# Patient Record
Sex: Female | Born: 1975 | Race: White | Hispanic: No | Marital: Single | Smoking: Current every day smoker
Health system: Southern US, Community
[De-identification: ages and names within clinical notes are randomized; demographics above are authoritative.]

---

## 1998-05-18 ENCOUNTER — Ambulatory Visit (HOSPITAL_COMMUNITY): Admission: RE | Admit: 1998-05-18 | Discharge: 1998-05-18 | Payer: Self-pay | Admitting: Obstetrics

## 1998-05-28 ENCOUNTER — Inpatient Hospital Stay (HOSPITAL_COMMUNITY): Admission: AD | Admit: 1998-05-28 | Discharge: 1998-05-28 | Payer: Self-pay | Admitting: *Deleted

## 1998-06-01 ENCOUNTER — Inpatient Hospital Stay (HOSPITAL_COMMUNITY): Admission: AD | Admit: 1998-06-01 | Discharge: 1998-06-01 | Payer: Self-pay | Admitting: Obstetrics

## 1998-06-04 ENCOUNTER — Inpatient Hospital Stay (HOSPITAL_COMMUNITY): Admission: AD | Admit: 1998-06-04 | Discharge: 1998-06-04 | Payer: Self-pay | Admitting: Obstetrics

## 1998-06-14 ENCOUNTER — Inpatient Hospital Stay: Admission: AD | Admit: 1998-06-14 | Discharge: 1998-06-14 | Payer: Self-pay | Admitting: Obstetrics

## 1998-06-22 ENCOUNTER — Inpatient Hospital Stay (HOSPITAL_COMMUNITY): Admission: AD | Admit: 1998-06-22 | Discharge: 1998-06-22 | Payer: Self-pay | Admitting: Obstetrics

## 1998-06-27 ENCOUNTER — Inpatient Hospital Stay (HOSPITAL_COMMUNITY): Admission: AD | Admit: 1998-06-27 | Discharge: 1998-06-27 | Payer: Self-pay | Admitting: *Deleted

## 1998-06-29 ENCOUNTER — Observation Stay (HOSPITAL_COMMUNITY): Admission: AD | Admit: 1998-06-29 | Discharge: 1998-06-30 | Payer: Self-pay | Admitting: Obstetrics & Gynecology

## 1998-07-03 ENCOUNTER — Encounter (HOSPITAL_COMMUNITY): Admission: RE | Admit: 1998-07-03 | Discharge: 1998-07-06 | Payer: Self-pay | Admitting: Obstetrics & Gynecology

## 1998-07-04 ENCOUNTER — Inpatient Hospital Stay (HOSPITAL_COMMUNITY): Admission: AD | Admit: 1998-07-04 | Discharge: 1998-07-06 | Payer: Self-pay | Admitting: *Deleted

## 1998-07-21 ENCOUNTER — Inpatient Hospital Stay (HOSPITAL_COMMUNITY): Admission: AD | Admit: 1998-07-21 | Discharge: 1998-07-21 | Payer: Self-pay | Admitting: *Deleted

## 1998-09-12 ENCOUNTER — Emergency Department (HOSPITAL_COMMUNITY): Admission: EM | Admit: 1998-09-12 | Discharge: 1998-09-12 | Payer: Self-pay | Admitting: Emergency Medicine

## 1998-09-12 ENCOUNTER — Encounter: Payer: Self-pay | Admitting: Emergency Medicine

## 2000-01-22 ENCOUNTER — Emergency Department (HOSPITAL_COMMUNITY): Admission: EM | Admit: 2000-01-22 | Discharge: 2000-01-22 | Payer: Self-pay | Admitting: Emergency Medicine

## 2006-11-26 ENCOUNTER — Inpatient Hospital Stay (HOSPITAL_COMMUNITY): Admission: AD | Admit: 2006-11-26 | Discharge: 2006-11-27 | Payer: Self-pay | Admitting: Obstetrics & Gynecology

## 2007-04-27 ENCOUNTER — Emergency Department (HOSPITAL_COMMUNITY): Admission: EM | Admit: 2007-04-27 | Discharge: 2007-04-27 | Payer: Self-pay | Admitting: Emergency Medicine

## 2009-07-31 ENCOUNTER — Inpatient Hospital Stay (HOSPITAL_COMMUNITY): Admission: AD | Admit: 2009-07-31 | Discharge: 2009-07-31 | Payer: Self-pay | Admitting: Obstetrics & Gynecology

## 2009-10-06 ENCOUNTER — Ambulatory Visit (HOSPITAL_COMMUNITY): Admission: RE | Admit: 2009-10-06 | Discharge: 2009-10-06 | Payer: Self-pay | Admitting: Obstetrics & Gynecology

## 2010-01-30 ENCOUNTER — Encounter: Payer: Self-pay | Admitting: Obstetrics & Gynecology

## 2010-01-30 ENCOUNTER — Ambulatory Visit: Payer: Self-pay | Admitting: Physician Assistant

## 2010-01-30 ENCOUNTER — Inpatient Hospital Stay (HOSPITAL_COMMUNITY): Admission: AD | Admit: 2010-01-30 | Discharge: 2010-02-01 | Payer: Self-pay | Admitting: Obstetrics & Gynecology

## 2010-03-07 ENCOUNTER — Inpatient Hospital Stay (HOSPITAL_COMMUNITY): Admission: AD | Admit: 2010-03-07 | Discharge: 2010-03-07 | Payer: Self-pay | Admitting: Family Medicine

## 2010-03-07 ENCOUNTER — Ambulatory Visit: Payer: Self-pay | Admitting: Obstetrics and Gynecology

## 2010-11-21 LAB — CBC
HCT: 33.5 % — ABNORMAL LOW (ref 36.0–46.0)
Hemoglobin: 11.5 g/dL — ABNORMAL LOW (ref 12.0–15.0)
MCH: 32.8 pg (ref 26.0–34.0)
MCHC: 34.3 g/dL (ref 30.0–36.0)
MCV: 95.6 fL (ref 78.0–100.0)
Platelets: 284 10*3/uL (ref 150–400)
RBC: 3.5 MIL/uL — ABNORMAL LOW (ref 3.87–5.11)
RDW: 11.8 % (ref 11.5–15.5)
WBC: 8.6 10*3/uL (ref 4.0–10.5)

## 2010-11-21 LAB — WET PREP, GENITAL: Trich, Wet Prep: NONE SEEN

## 2010-11-22 LAB — CBC
MCV: 97.6 fL (ref 78.0–100.0)
RBC: 4.19 MIL/uL (ref 3.87–5.11)
WBC: 16.1 10*3/uL — ABNORMAL HIGH (ref 4.0–10.5)

## 2010-11-22 LAB — RPR: RPR Ser Ql: NONREACTIVE

## 2010-12-08 LAB — URINALYSIS, ROUTINE W REFLEX MICROSCOPIC
Bilirubin Urine: NEGATIVE
Nitrite: NEGATIVE
Specific Gravity, Urine: 1.015 (ref 1.005–1.030)
pH: 7.5 (ref 5.0–8.0)

## 2010-12-08 LAB — RAPID URINE DRUG SCREEN, HOSP PERFORMED
Cocaine: NOT DETECTED
Opiates: NOT DETECTED
Tetrahydrocannabinol: NOT DETECTED

## 2010-12-08 LAB — ABO/RH: ABO/RH(D): A POS

## 2010-12-08 LAB — WET PREP, GENITAL: Trich, Wet Prep: NONE SEEN

## 2013-04-14 ENCOUNTER — Emergency Department (HOSPITAL_COMMUNITY)
Admission: EM | Admit: 2013-04-14 | Discharge: 2013-04-15 | Disposition: A | Discharge status: Home / Self Care | Payer: No Typology Code available for payment source | Attending: Emergency Medicine | Admitting: Emergency Medicine

## 2013-04-14 ENCOUNTER — Encounter (HOSPITAL_COMMUNITY): Payer: Self-pay

## 2013-04-14 DIAGNOSIS — Y9389 Activity, other specified: Secondary | ICD-10-CM | POA: Insufficient documentation

## 2013-04-14 DIAGNOSIS — Y92410 Unspecified street and highway as the place of occurrence of the external cause: Secondary | ICD-10-CM | POA: Insufficient documentation

## 2013-04-14 DIAGNOSIS — Z880 Allergy status to penicillin: Secondary | ICD-10-CM | POA: Insufficient documentation

## 2013-04-14 DIAGNOSIS — IMO0002 Reserved for concepts with insufficient information to code with codable children: Secondary | ICD-10-CM | POA: Insufficient documentation

## 2013-04-14 DIAGNOSIS — T148XXA Other injury of unspecified body region, initial encounter: Secondary | ICD-10-CM

## 2013-04-14 MED ORDER — HYDROCODONE-ACETAMINOPHEN 5-325 MG PO TABS
1.0000 | ORAL_TABLET | Freq: Once | ORAL | Status: AC
  Administered 2013-04-14: 1 via ORAL
  Filled 2013-04-14: qty 1

## 2013-04-14 MED ORDER — BACITRACIN-NEOMYCIN-POLYMYXIN OINTMENT TUBE
TOPICAL_OINTMENT | Freq: Once | CUTANEOUS | Status: AC
  Administered 2013-04-15: via TOPICAL
  Filled 2013-04-14: qty 15

## 2013-04-14 MED ORDER — HYDROCODONE-ACETAMINOPHEN 5-325 MG PO TABS: 1.0000 | ORAL_TABLET | Freq: Four times a day (QID) | ORAL | Status: DC | PRN
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
## 2013-04-15 NOTE — ED Provider Notes (Signed)
Medical screening examination/treatment/procedure(s) were performed by non-physician practitioner and as supervising physician I was immediately available for consultation/collaboration.   Hanley Seamen, MD 04/15/13 [phone]

## 2013-04-26 ENCOUNTER — Emergency Department (HOSPITAL_COMMUNITY): Payer: No Typology Code available for payment source

## 2013-04-26 ENCOUNTER — Emergency Department (HOSPITAL_COMMUNITY)
Admission: EM | Admit: 2013-04-26 | Discharge: 2013-04-26 | Disposition: A | Discharge status: Home / Self Care | Payer: No Typology Code available for payment source | Attending: Emergency Medicine | Admitting: Emergency Medicine

## 2013-04-26 ENCOUNTER — Encounter (HOSPITAL_COMMUNITY): Payer: Self-pay | Admitting: *Deleted

## 2013-04-26 DIAGNOSIS — Z5189 Encounter for other specified aftercare: Secondary | ICD-10-CM | POA: Insufficient documentation

## 2013-04-26 DIAGNOSIS — S4991XD Unspecified injury of right shoulder and upper arm, subsequent encounter: Secondary | ICD-10-CM

## 2013-04-26 DIAGNOSIS — Z880 Allergy status to penicillin: Secondary | ICD-10-CM | POA: Insufficient documentation

## 2013-04-26 DIAGNOSIS — Z87828 Personal history of other (healed) physical injury and trauma: Secondary | ICD-10-CM | POA: Insufficient documentation

## 2013-04-26 DIAGNOSIS — M549 Dorsalgia, unspecified: Secondary | ICD-10-CM | POA: Insufficient documentation

## 2013-04-26 DIAGNOSIS — Z79899 Other long term (current) drug therapy: Secondary | ICD-10-CM | POA: Insufficient documentation

## 2013-04-26 DIAGNOSIS — M25529 Pain in unspecified elbow: Secondary | ICD-10-CM | POA: Insufficient documentation

## 2013-04-26 MED ORDER — KETOROLAC TROMETHAMINE 60 MG/2ML IM SOLN
60.0000 mg | Freq: Once | INTRAMUSCULAR | Status: AC
  Administered 2013-04-26: 60 mg via INTRAMUSCULAR
  Filled 2013-04-26: qty 2

## 2013-04-26 MED ORDER — IBUPROFEN 800 MG PO TABS: 800.0000 mg | ORAL_TABLET | Freq: Three times a day (TID) | ORAL | Status: DC | PRN

## 2013-04-26 MED ORDER — HYDROCODONE-ACETAMINOPHEN 5-325 MG PO TABS: 1.0000 | ORAL_TABLET | Freq: Four times a day (QID) | ORAL | Status: DC | PRN
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
## 2013-04-26 NOTE — ED Provider Notes (Signed)
Medical screening examination/treatment/procedure(s) were performed by non-physician practitioner and as supervising physician I was immediately available for consultation/collaboration.    Rubert Frediani R Argenis Kumari, MD 04/26/13 1405 

## 2013-07-11 ENCOUNTER — Encounter (HOSPITAL_COMMUNITY): Payer: Self-pay | Admitting: Emergency Medicine

## 2013-07-11 ENCOUNTER — Emergency Department (HOSPITAL_COMMUNITY)
Admission: EM | Admit: 2013-07-11 | Discharge: 2013-07-11 | Disposition: A | Discharge status: Home / Self Care | Payer: Medicaid Other | Attending: Emergency Medicine | Admitting: Emergency Medicine

## 2013-07-11 DIAGNOSIS — R590 Localized enlarged lymph nodes: Secondary | ICD-10-CM

## 2013-07-11 DIAGNOSIS — Z3202 Encounter for pregnancy test, result negative: Secondary | ICD-10-CM | POA: Insufficient documentation

## 2013-07-11 DIAGNOSIS — Z880 Allergy status to penicillin: Secondary | ICD-10-CM | POA: Insufficient documentation

## 2013-07-11 DIAGNOSIS — N898 Other specified noninflammatory disorders of vagina: Secondary | ICD-10-CM | POA: Insufficient documentation

## 2013-07-11 DIAGNOSIS — Z79899 Other long term (current) drug therapy: Secondary | ICD-10-CM | POA: Insufficient documentation

## 2013-07-11 DIAGNOSIS — R599 Enlarged lymph nodes, unspecified: Secondary | ICD-10-CM | POA: Insufficient documentation

## 2013-07-11 DIAGNOSIS — B9689 Other specified bacterial agents as the cause of diseases classified elsewhere: Secondary | ICD-10-CM

## 2013-07-11 DIAGNOSIS — N760 Acute vaginitis: Secondary | ICD-10-CM | POA: Insufficient documentation

## 2013-07-11 LAB — URINALYSIS, ROUTINE W REFLEX MICROSCOPIC
Ketones, ur: NEGATIVE mg/dL
Leukocytes, UA: NEGATIVE
Nitrite: NEGATIVE
Protein, ur: NEGATIVE mg/dL
Urobilinogen, UA: 0.2 mg/dL (ref 0.0–1.0)

## 2013-07-11 LAB — PREGNANCY, URINE: Preg Test, Ur: NEGATIVE

## 2013-07-11 MED ORDER — CEFTRIAXONE SODIUM 250 MG IJ SOLR
250.0000 mg | Freq: Once | INTRAMUSCULAR | Status: AC
  Administered 2013-07-11: 250 mg via INTRAMUSCULAR
  Filled 2013-07-11: qty 250

## 2013-07-11 MED ORDER — AZITHROMYCIN 250 MG PO TABS
1000.0000 mg | ORAL_TABLET | Freq: Once | ORAL | Status: AC
  Administered 2013-07-11: 1000 mg via ORAL
  Filled 2013-07-11: qty 4

## 2013-07-11 NOTE — ED Provider Notes (Signed)
CSN: [id number]     Arrival date & time 07/11/13  1131 History  This chart was scribed for non-physician practitioner Fayrene Helper, PA-C working with Dagmar Hait, MD by Leone Payor, ED Scribe. This patient was seen in room TR11C/TR11C and the patient's care was started at 1131.     Chief Complaint  Patient presents with  . Abscess    The history is provided by the patient. No language interpreter was used.    HPI Comments: Katie Mcdonald is a 37 y.o. female who presents to the Emergency Department complaining of 4 days of gradual onset, gradually worsening, constant pain and swelling to the right groin area. Pt states she has applied warm compresses without relief. She denies similar symptoms in the past. She reports last sexual encounter was 1.5-2 months ago. She denies vaginal bleeding, vaginal discharge, dysuria, fever.   History reviewed. No pertinent past medical history. History reviewed. No pertinent past surgical history. History reviewed. No pertinent family history. History  Substance Use Topics  . Smoking status: Not on file  . Smokeless tobacco: Not on file  . Alcohol Use: Not on file   OB History   Grav Para Term Preterm Abortions TAB SAB Ect Mult Living                 Review of Systems  Constitutional: Negative for fever.  Genitourinary: Negative for dysuria, vaginal bleeding and vaginal discharge.       Right groin pain and swelling    Allergies  Penicillins and Onion  Home Medications   Current Outpatient Rx  Name  Route  Sig  Dispense  Refill  . CALCIUM PO   Oral   Take 1 tablet by mouth 2 (two) times daily.         . Multiple Vitamin (MULTIVITAMIN WITH MINERALS) TABS tablet   Oral   Take 1 tablet by mouth daily.          BP 127/74  Pulse 71  Temp(Src) 98.2 F (36.8 C) (Oral)  Resp 22  Ht 5\' 3"  (1.6 m)  Wt 200 lb 4.8 oz (90.855 kg)  BMI 35.49 kg/m2  SpO2 99% Physical Exam  Nursing note and vitals reviewed. Constitutional: She is  oriented to person, place, and time. She appears well-developed and well-nourished. No distress.  HENT:  Head: Normocephalic and atraumatic.  Eyes: Conjunctivae are normal.  Neck: Normal range of motion. Neck supple.  Cardiovascular: Normal rate and regular rhythm.   Pulmonary/Chest: Effort normal and breath sounds normal. She exhibits no tenderness.  Abdominal: Soft. She exhibits no distension. There is no tenderness. Hernia confirmed negative in the right inguinal area.  Genitourinary: Uterus normal. There is no rash or lesion on the right labia. There is no rash or lesion on the left labia. Cervix exhibits no motion tenderness and no discharge. Right adnexum displays no mass and no tenderness. Left adnexum displays no mass and no tenderness. No erythema, tenderness or bleeding around the vagina. Vaginal discharge (mild thin white discharge) found.  Chaperone present:  Lymphadenopathy:       Right: Inguinal adenopathy present.       Left: No inguinal adenopathy present.  Neurological: She is alert and oriented to person, place, and time.  Skin: Skin is warm and dry. No rash noted.  Psychiatric: She has a normal mood and affect.    ED Course  Procedures (including critical care time)  DIAGNOSTIC STUDIES: Oxygen Saturation is 99% on RA, normal by  my interpretation.    COORDINATION OF CARE: 12:13 PM Discussed with pt that it is more likely the area is an enlarged lymph node rather than an abscess. Will perform a pelvic exam. Discussed treatment plan with pt at bedside and pt agreed to plan.   1:48 PM No discomfort or evidence of PID on pelvic exam.  Wet prep with moderate clue cell.  Will treat for BV with flagyl.  WBC TNTC, therefore will give rocephin/zithromax as prophylactic treatment for possible STI.  Cultures sent.  Return precaution discussed.  Low suspicion for cancer or cutaneous abscess.    Labs Review Labs Reviewed - No data to display Imaging Review No results  found.  EKG Interpretation   None       MDM   1. Inguinal lymphadenopathy   2. BV (bacterial vaginosis)    BP 127/74  Pulse 71  Temp(Src) 98.2 F (36.8 C) (Oral)  Resp 22  Ht 5\' 3"  (1.6 m)  Wt 200 lb 4.8 oz (90.855 kg)  BMI 35.49 kg/m2  SpO2 99%   I personally performed the services described in this documentation, which was scribed in my presence. The recorded information has been reviewed and is accurate.    Fayrene Helper, PA-C 07/11/13 1351

## 2013-07-11 NOTE — ED Provider Notes (Signed)
Medical screening examination/treatment/procedure(s) were performed by non-physician practitioner and as supervising physician I was immediately available for consultation/collaboration.  EKG Interpretation   None         Dagmar Hait, MD 07/11/13 1538

## 2013-07-11 NOTE — ED Notes (Signed)
Pt in c/o abscess to groin area since Sunday, denies drainage from area, denies history of same

## 2015-12-28 ENCOUNTER — Encounter (HOSPITAL_COMMUNITY): Payer: Self-pay | Admitting: Emergency Medicine

## 2015-12-28 ENCOUNTER — Emergency Department (HOSPITAL_COMMUNITY): Payer: Self-pay

## 2015-12-28 ENCOUNTER — Emergency Department (HOSPITAL_COMMUNITY)
Admission: EM | Admit: 2015-12-28 | Discharge: 2015-12-28 | Disposition: A | Discharge status: Home / Self Care | Payer: Self-pay | Attending: Emergency Medicine | Admitting: Emergency Medicine

## 2015-12-28 DIAGNOSIS — Z79899 Other long term (current) drug therapy: Secondary | ICD-10-CM | POA: Insufficient documentation

## 2015-12-28 DIAGNOSIS — F17200 Nicotine dependence, unspecified, uncomplicated: Secondary | ICD-10-CM | POA: Insufficient documentation

## 2015-12-28 DIAGNOSIS — J189 Pneumonia, unspecified organism: Secondary | ICD-10-CM

## 2015-12-28 DIAGNOSIS — J159 Unspecified bacterial pneumonia: Secondary | ICD-10-CM | POA: Insufficient documentation

## 2015-12-28 MED ORDER — LEVOFLOXACIN 750 MG PO TABS
750.0000 mg | ORAL_TABLET | Freq: Once | ORAL | Status: AC
  Administered 2015-12-28: 750 mg via ORAL
  Filled 2015-12-28: qty 1

## 2015-12-28 MED ORDER — GUAIFENESIN-CODEINE 100-10 MG/5ML PO SOLN
5.0000 mL | Freq: Once | ORAL | Status: AC
  Administered 2015-12-28: 5 mL via ORAL
  Filled 2015-12-28: qty 5

## 2015-12-28 MED ORDER — LEVOFLOXACIN 750 MG PO TABS: 750.0000 mg | ORAL_TABLET | Freq: Every day | ORAL | Status: AC

## 2015-12-28 MED ORDER — GUAIFENESIN-CODEINE 100-10 MG/5ML PO SOLN: 5.0000 mL | Freq: Three times a day (TID) | ORAL | Status: AC | PRN

## 2015-12-28 NOTE — Discharge Instructions (Signed)
Take medications as prescribed. Follow up with your doctor in regards to your hospital visit. If you do not have a primary physician, call the phone number listed on discharge instructions for help finding one. You may return to the emergency department if symptoms worsen, become progressive, or become more concerning.   Pneumonia, Adult Pneumonia is an infection of the lungs.   CAUSES Pneumonia may be caused by bacteria or a virus. Usually, these infections are caused by breathing infectious particles into the lungs (respiratory tract).  SYMPTOMS   Cough.   Fever.   Chest pain.   Increased rate of breathing.   Wheezing.   Mucus production.   DIAGNOSIS  If you have the common symptoms of pneumonia, your caregiver will typically confirm the diagnosis with a chest X-ray. The X-ray will show an abnormality in the lung (pulmonary infiltrate) if you have pneumonia. Other tests of your blood, urine, or sputum may be done to find the specific cause of your pneumonia. Your caregiver may also do tests (blood gases or pulse oximetry) to see how well your lungs are working.  TREATMENT  Some forms of pneumonia may be spread to other people when you cough or sneeze. You may be asked to wear a mask before and during your exam. Pneumonia that is caused by bacteria is treated with antibiotic medicine. Pneumonia that is caused by the influenza virus may be treated with an antiviral medicine. Most other viral infections must run their course. These infections will not respond to antibiotics.   PREVENTION A pneumococcal shot (vaccine) is available to prevent a common bacterial cause of pneumonia. This is usually suggested for:  People over 38 years old.   Patients on chemotherapy.   People with chronic lung problems, such as bronchitis or emphysema.   People with immune system problems.  If you are over 65 or have a high risk condition, you may receive the pneumococcal vaccine if you have not  received it before. In some countries, a routine influenza vaccine is also recommended. This vaccine can help prevent some cases of pneumonia.You may be offered the influenza vaccine as part of your care. If you smoke, it is time to quit. You may receive instructions on how to stop smoking. Your caregiver can provide medicines and counseling to help you quit.  HOME CARE INSTRUCTIONS   Cough suppressants may be used if you are losing too much rest. However, coughing protects you by clearing your lungs. You should avoid using cough suppressants if you can.   Your caregiver may have prescribed medicine if he or she thinks your pneumonia is caused by a bacteria or influenza. Finish your medicine even if you start to feel better.   Your caregiver may also prescribe an expectorant. This loosens the mucus to be coughed up.   Only take over-the-counter or prescription medicines for pain, discomfort, or fever as directed by your caregiver.   Do not smoke. Smoking is a common cause of bronchitis and can contribute to pneumonia. If you are a smoker and continue to smoke, your cough may last several weeks after your pneumonia has cleared.   A cold steam vaporizer or humidifier in your room or home may help loosen mucus.   Coughing is often worse at night. Sleeping in a semi-upright position in a recliner or using a couple pillows under your head will help with this.   Get rest as you feel it is needed. Your body will usually let you know when you  need to rest.   SEEK IMMEDIATE MEDICAL CARE IF:   Your illness becomes worse. This is especially true if you are elderly or weakened from any other disease.   You cannot control your cough with suppressants and are losing sleep.   You begin coughing up blood.   You develop pain which is getting worse or is uncontrolled with medicines.   You have a fever.   Any of the symptoms which initially brought you in for treatment are getting worse rather than  better.   You develop shortness of breath or chest pain.   MAKE SURE YOU:   Understand these instructions.   Will watch your condition.   Will get help right away if you are not doing well or get worse.

## 2015-12-28 NOTE — ED Provider Notes (Signed)
CSN: [id number]     Arrival date & time 12/28/15  [id number] History  By signing my name below, I, Katie Mcdonald, attest that this documentation has been prepared under the direction and in the presence of Katie SauerJaime Shifra Swartzentruber, PA-C Electronically Signed: Charline BillsEssence Mcdonald, ED Scribe 12/28/2015 at 10:49 AM.   Chief Complaint  Patient presents with  . Cough   The history is provided by the patient. No language interpreter was used.   HPI Comments: Katie Mcdonald is a 40 y.o. female who presents to the Emergency Department complaining of gradually worsening productive cough with phlegm for the past 4 days. Pt reports worsened cough with lying flat. She reports associated chills, congestion, fever with Tmax of 101.2 F; Triage temperature 98. 2 F. Pt has tried cold, flu and cough Equate and Excedrin without significant relief. She denies SOB. Pt is a smoker. Allergy to Penicillins.   History reviewed. No pertinent past medical history. History reviewed. No pertinent past surgical history. No family history on file. Social History  Substance Use Topics  . Smoking status: Current Every Day Smoker  . Smokeless tobacco: None  . Alcohol Use: None   OB History    No data available     Review of Systems  Constitutional: Positive for fever and chills.  HENT: Positive for congestion.   Respiratory: Positive for cough. Negative for shortness of breath.    Allergies  Penicillins and Onion  Home Medications   Prior to Admission medications   Medication Sig Start Date End Date Taking? Authorizing Provider  CALCIUM PO Take 1 tablet by mouth 2 (two) times daily.    Historical Provider, MD  guaiFENesin-codeine 100-10 MG/5ML syrup Take 5 mLs by mouth 3 (three) times daily as needed for cough. 12/28/15   Katie PicketJaime Pilcher Gladiola Madore, PA-C  levofloxacin (LEVAQUIN) 750 MG tablet Take 1 tablet (750 mg total) by mouth daily. 12/28/15   Katie PicketJaime Pilcher Tennille Montelongo, PA-C  Multiple Vitamin (MULTIVITAMIN WITH MINERALS) TABS tablet Take 1 tablet  by mouth daily.    Historical Provider, MD   BP 134/89 mmHg  Pulse 90  Temp(Src) 98.2 F (36.8 C) (Oral)  Resp 18  Ht 5\' 3"  (1.6 m)  Wt 180 lb (81.647 kg)  BMI 31.89 kg/m2  SpO2 97%  LMP 12/01/2015 Physical Exam  Constitutional: She is oriented to person, place, and time. She appears well-developed and well-nourished. No distress.  HENT:  Head: Normocephalic and atraumatic.  Oropharynx with erythema, no exudates or tonsillar hypertrophy. Positive nasal congestion with mucosal edema.   Eyes: Conjunctivae and EOM are normal.  Neck: Neck supple. No tracheal deviation present.  Cardiovascular: Normal rate, regular rhythm and normal heart sounds.   Pulmonary/Chest: Effort normal and breath sounds normal. No respiratory distress. She has no wheezes. She has no rales. She exhibits tenderness.  Coughing fits with deep breathing.  Abdominal: Soft. She exhibits no distension. There is no tenderness.  Musculoskeletal: Normal range of motion.  Neurological: She is alert and oriented to person, place, and time.  Skin: Skin is warm and dry.  Psychiatric: She has a normal mood and affect. Her behavior is normal.  Nursing note and vitals reviewed.  ED Course  Procedures (including critical care time) DIAGNOSTIC STUDIES: Oxygen Saturation is 97% on RA, normal by my interpretation.    COORDINATION OF CARE: 10:26 AM-Discussed treatment plan which includes CXR, guaifenesin-codeine and Levaquin with pt at bedside and pt agreed to plan.   Labs Review Labs Reviewed - No data to display  Imaging Review Dg Chest 2 View  12/28/2015  CLINICAL DATA:  Cough/cong and fever for 5 days EXAM: CHEST  2 VIEW COMPARISON:  None. FINDINGS: The heart size and vascular pattern are normal. The left lung is clear. There is mild opacity in the anterior right middle lobe. No pleural effusions. IMPRESSION: Findings concerning for subtle pneumonia right middle lobe. Followup PA and lateral chest X-ray is recommended in  3-4 weeks following trial of antibiotic therapy to ensure resolution, Electronically Signed   By: Katie Mcdonald M.D.   On: 12/28/2015 09:47   I have personally reviewed and evaluated these images and lab results as part of my medical decision-making.   EKG Interpretation None      MDM   Final diagnoses:  Community acquired pneumonia   Katie Mcdonald presents to the emergency department for cough, congestion 4 days. She states she has been febrile at home Tmax 101.2. On exam, she is afebrile and I did not hear any lung abnormalities. Chest x-ray was obtained which showed concerning for subtle pneumonia in the right middle lobe. Given that patient is a smoker and had fever at home, will treat for pneumonia. She is allergic to penicillins-will treat with Levaquin. I believe she is okay for home outpatient treatment. Significant amount of time was spent discussing smoking cessation. Reasons to return to the emergency department were discussed. Importance of finding a primary care physician for also discussed. All questions were answered.  I personally performed the services described in this documentation, which was scribed in my presence. The recorded information has been reviewed and is accurate.  [facility] Katie Ybanez, PA-C 12/28/15 1114  Katie Barrette, MD 12/28/15 [phone]

## 2015-12-28 NOTE — ED Notes (Signed)
Patient here with cough and congestion x 4 days, no distress

## 2015-12-28 NOTE — ED Notes (Signed)
Declined W/C at D/C and was escorted to lobby by RN. 

## 2019-03-24 ENCOUNTER — Encounter (HOSPITAL_COMMUNITY): Payer: Self-pay | Admitting: Emergency Medicine

## 2019-03-24 ENCOUNTER — Emergency Department (HOSPITAL_COMMUNITY)
Admission: EM | Admit: 2019-03-24 | Discharge: 2019-03-24 | Disposition: A | Discharge status: Home / Self Care | Payer: PRIVATE HEALTH INSURANCE | Attending: Emergency Medicine | Admitting: Emergency Medicine

## 2019-03-24 ENCOUNTER — Emergency Department (HOSPITAL_COMMUNITY): Payer: PRIVATE HEALTH INSURANCE

## 2019-03-24 ENCOUNTER — Other Ambulatory Visit: Payer: Self-pay

## 2019-03-24 DIAGNOSIS — S93431A Sprain of tibiofibular ligament of right ankle, initial encounter: Secondary | ICD-10-CM | POA: Diagnosis not present

## 2019-03-24 DIAGNOSIS — Y999 Unspecified external cause status: Secondary | ICD-10-CM | POA: Insufficient documentation

## 2019-03-24 DIAGNOSIS — F17210 Nicotine dependence, cigarettes, uncomplicated: Secondary | ICD-10-CM | POA: Insufficient documentation

## 2019-03-24 DIAGNOSIS — Y9301 Activity, walking, marching and hiking: Secondary | ICD-10-CM | POA: Diagnosis not present

## 2019-03-24 DIAGNOSIS — X501XXA Overexertion from prolonged static or awkward postures, initial encounter: Secondary | ICD-10-CM | POA: Diagnosis not present

## 2019-03-24 DIAGNOSIS — Y92410 Unspecified street and highway as the place of occurrence of the external cause: Secondary | ICD-10-CM | POA: Insufficient documentation

## 2019-03-24 DIAGNOSIS — S99911A Unspecified injury of right ankle, initial encounter: Secondary | ICD-10-CM | POA: Diagnosis present

## 2019-03-24 MED ORDER — CYCLOBENZAPRINE HCL 10 MG PO TABS: 10.0000 mg | ORAL_TABLET | Freq: Two times a day (BID) | ORAL | 0 refills | Status: AC | PRN

## 2019-03-24 MED ORDER — IBUPROFEN 600 MG PO TABS: 600.0000 mg | ORAL_TABLET | Freq: Four times a day (QID) | ORAL | 0 refills | Status: AC | PRN

## 2019-03-24 NOTE — Progress Notes (Signed)
Orthopedic Tech Progress Note Patient Details:  Katie Mcdonald 1975-11-29 [id number]  Ortho Devices Type of Ortho Device: ASO, Crutches Ortho Device/Splint Location: LRE Ortho Device/Splint Interventions: Application, Adjustment, Ordered   Post Interventions Patient Tolerated: Well, Ambulated well Instructions Provided: Care of device, Adjustment of device   Janit Pagan 03/24/2019, 2:16 PM
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
## 2019-03-24 NOTE — ED Provider Notes (Signed)
[facility] EMERGENCY DEPARTMENT Provider Note   CSN: [id number] Arrival date & time: 03/24/19  1142     History   Chief Complaint Chief Complaint  Patient presents with   Ankle Pain    HPI ADEL NEYER is a 43 y.o. female.     The history is provided by the patient. No language interpreter was used.  Ankle Pain Associated symptoms: no fever      43 year old female presenting for evaluation of ankle injury.  Patient reports 2 days ago she was walking getting up from a bus and stepped on a pothole and injured her right ankle.  She report acute onset of sharp throbbing pain about the lateral aspects of her ankle follows with a pop.  Pain has been moderate to severe, worsening with movement.  Pain is nonradiating without any associated numbness.  No knee pain or hip pain.  She has been icing it and elevating it with some relief.  She was able to continue with her shift on the day of the injury but yesterday she took a day off due to the increasing pain.  She denies prior injury to the same ankle.  She has been taking ibuprofen for pain.  At rest her pain is currently 4 out of 10.  No past medical history on file.  There are no active problems to display for this patient.   No past surgical history on file.   OB History   No obstetric history on file.      Home Medications    Prior to Admission medications   Medication Sig Start Date End Date Taking? Authorizing Provider  CALCIUM PO Take 1 tablet by mouth 2 (two) times daily.    [provider]  guaiFENesin-codeine 100-10 MG/5ML syrup Take 5 mLs by mouth 3 (three) times daily as needed for cough. 12/28/15   Ward, Ozella Almond, PA-C  levofloxacin (LEVAQUIN) 750 MG tablet Take 1 tablet (750 mg total) by mouth daily. 12/28/15   Ward, Ozella Almond, PA-C  Multiple Vitamin (MULTIVITAMIN WITH MINERALS) TABS tablet Take 1 tablet by mouth daily.    [provider]    Family History No family  history on file.  Social History Social History   Tobacco Use   Smoking status: Current Every Day Smoker  Substance Use Topics   Alcohol use: Not on file   Drug use: Not on file     Allergies   Penicillins and Onion   Review of Systems Review of Systems  Constitutional: Negative for fever.  Musculoskeletal: Positive for joint swelling.  Neurological: Negative for numbness.     Physical Exam Updated Vital Signs BP 133/80 (BP Location: Left Arm)    Pulse 68    Temp 98.5 F (36.9 C) (Oral)    Resp 16    Ht 5\' 3"  (1.6 m)    LMP 03/23/2019    SpO2 100%    BMI 31.89 kg/m   Physical Exam Vitals signs and nursing note reviewed.  Constitutional:      General: She is not in acute distress.    Appearance: She is well-developed.  HENT:     Head: Atraumatic.  Eyes:     Conjunctiva/sclera: Conjunctivae normal.  Neck:     Musculoskeletal: Neck supple.  Musculoskeletal:        General: Swelling (Right ankle: Edema and tenderness noted to lateral malleoli region with tenderness to palpation.  No crepitus.  Decreased ankle range of motion secondary to  pain.  Dorsalis pedis pulse palpable brisk cap refill, no tenderness to fifth metatarsal.) present.     Comments: Right knee nontender.  Skin:    Findings: No rash.  Neurological:     Mental Status: She is alert.      ED Treatments / Results  Labs (all labs ordered are listed, but only abnormal results are displayed) Labs Reviewed - No data to display  EKG None  Radiology Dg Ankle Complete Right  Result Date: 03/24/2019 CLINICAL DATA:  Ankle pain after an injury. EXAM: RIGHT ANKLE - COMPLETE 3+ VIEW COMPARISON:  None. FINDINGS: Mild soft tissue swelling overlying the lateral malleolus. No fracture or acute bony findings. Plafond and talar dome intact. Small Achilles calcaneal spur. IMPRESSION: 1. Soft tissue swelling overlying the lateral malleolus, but without a discrete fracture identified. If pain persists despite  conservative therapy, MRI may be warranted for further characterization. Electronically Signed   By: Gaylyn RongWalter  Liebkemann M.D.   On: 03/24/2019 13:21    Procedures Procedures (including critical care time)  Medications Ordered in ED Medications - No data to display   Initial Impression / Assessment and Plan / ED Course  I have reviewed the triage vital signs and the nursing notes.  Pertinent labs & imaging results that were available during my care of the patient were reviewed by me and considered in my medical decision making (see chart for details).        BP 133/80 (BP Location: Left Arm)    Pulse 68    Temp 98.5 F (36.9 C) (Oral)    Resp 16    Ht 5\' 3"  (1.6 m)    LMP 03/23/2019    SpO2 100%    BMI 31.89 kg/m    Final Clinical Impressions(s) / ED Diagnoses   Final diagnoses:  Sprain of tibiofibular ligament of right ankle, initial encounter    ED Discharge Orders         Ordered    ibuprofen (ADVIL) 600 MG tablet  Every 6 hours PRN     03/24/19 1341    cyclobenzaprine (FLEXERIL) 10 MG tablet  2 times daily PRN     03/24/19 1341         12:13 PM Patient with mechanical injury to right ankle.  Swelling noted to lateral malleoli region.  Suspect ankle sprain.  X-ray ordered to rule out fracture.  Patient otherwise neurovascular intact.  1:37 PM X-ray demonstrates soft tissue swelling overlying the lateral malleolus of the right ankle without any discrete fracture identified.  However if pain persist despite conservative therapies, MRI may be warranted for further characterization.  Patient will be placed in an ASO, and crutches.  Rice therapy discussed.  Orthopedic referral given as needed.  Return precaution discussed.   Fayrene Helperran, Chawn Spraggins, PA-C 03/24/19 1343    Sabas SousBero, Michael M, MD 03/25/19 [phone]

## 2019-07-23 ENCOUNTER — Other Ambulatory Visit: Payer: Self-pay

## 2019-07-23 DIAGNOSIS — Z20822 Contact with and (suspected) exposure to covid-19: Secondary | ICD-10-CM

## 2019-07-25 LAB — NOVEL CORONAVIRUS, NAA: SARS-CoV-2, NAA: NOT DETECTED

## 2020-02-14 IMAGING — CR RIGHT ANKLE - COMPLETE 3+ VIEW
3 series · 3 of 3 positions shown · non-contrast
Comparison: None.

CLINICAL DATA: Ankle pain after an injury.

EXAM:
RIGHT ANKLE - COMPLETE 3+ VIEW

[ankle ap]
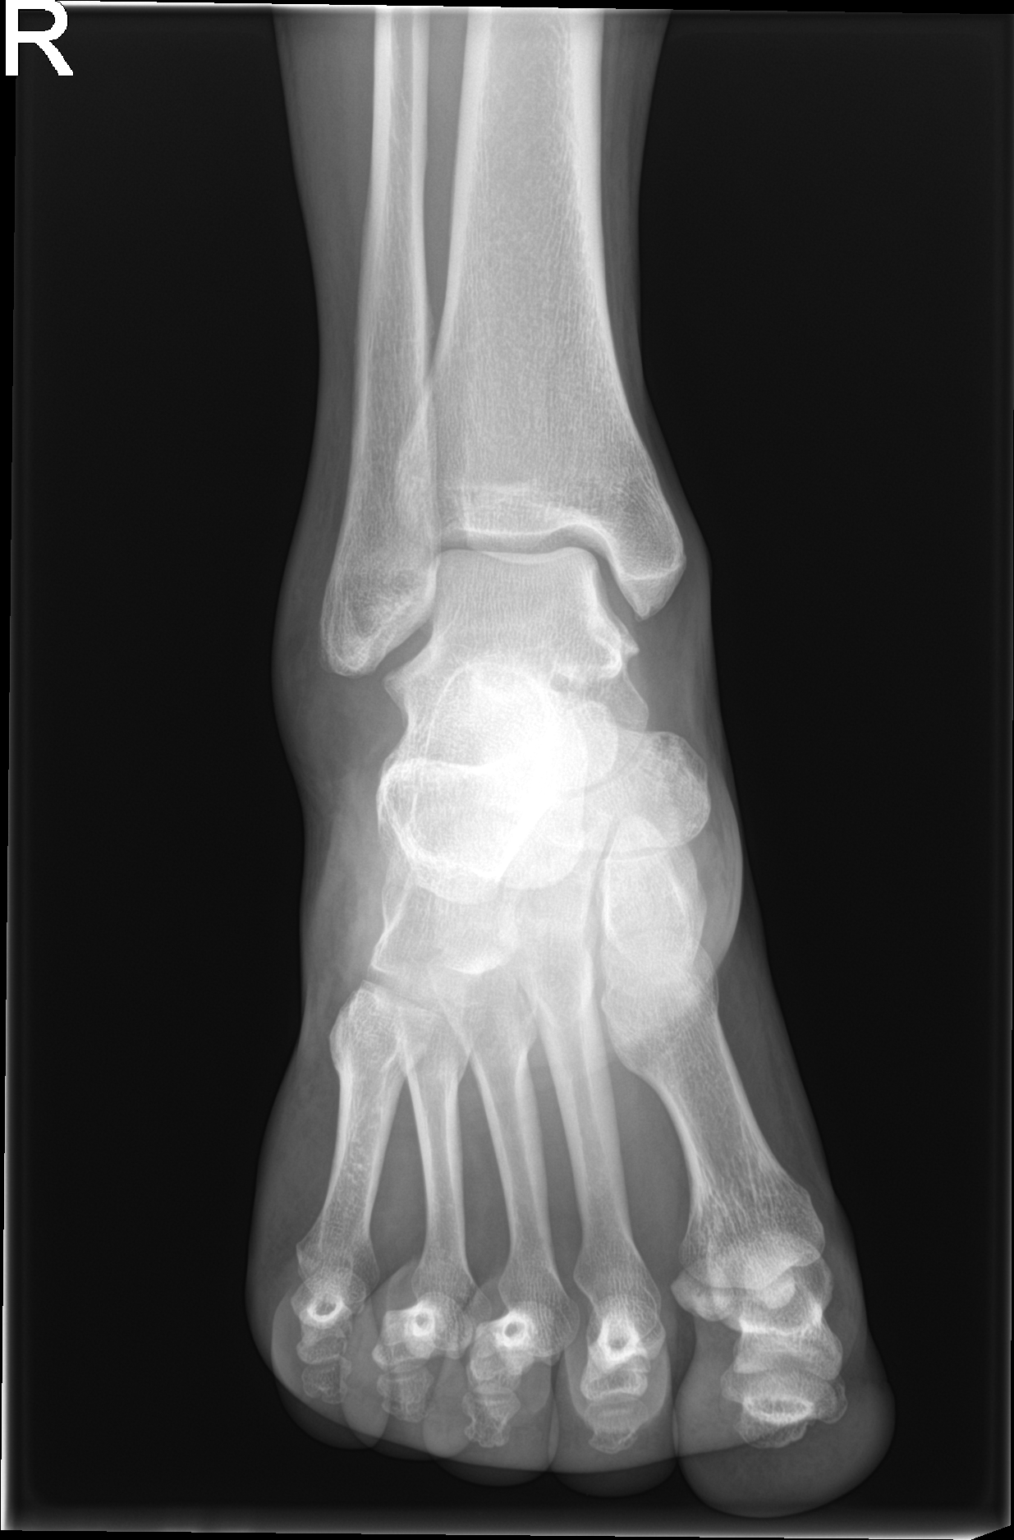

[ankle obl]
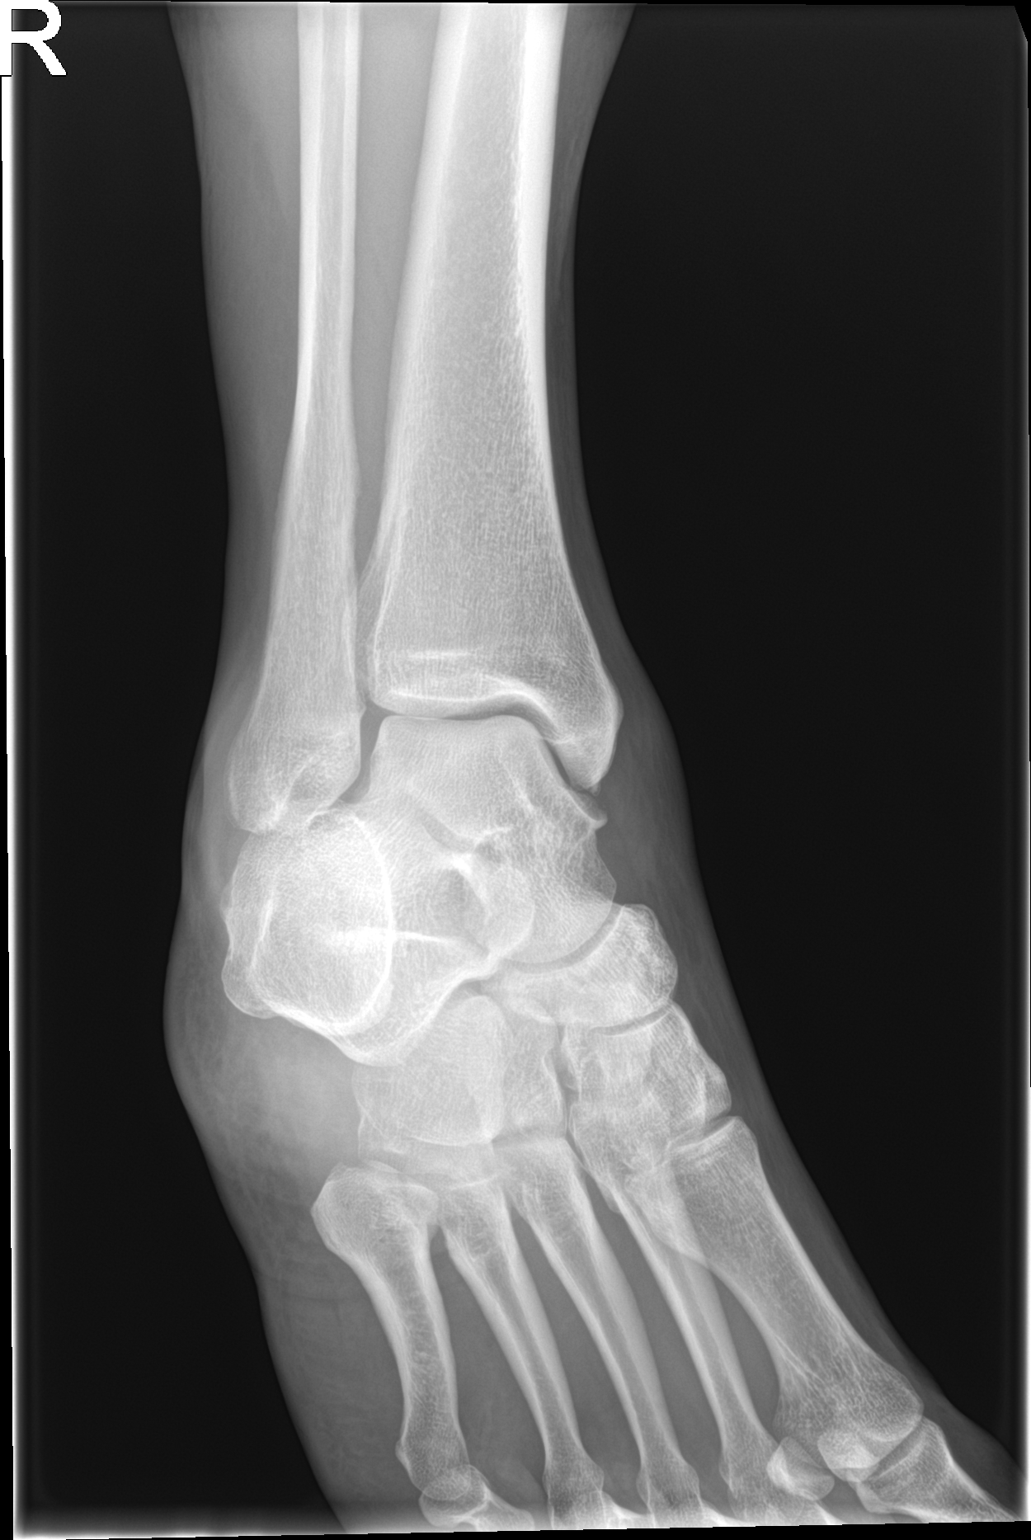

[ankle lat]
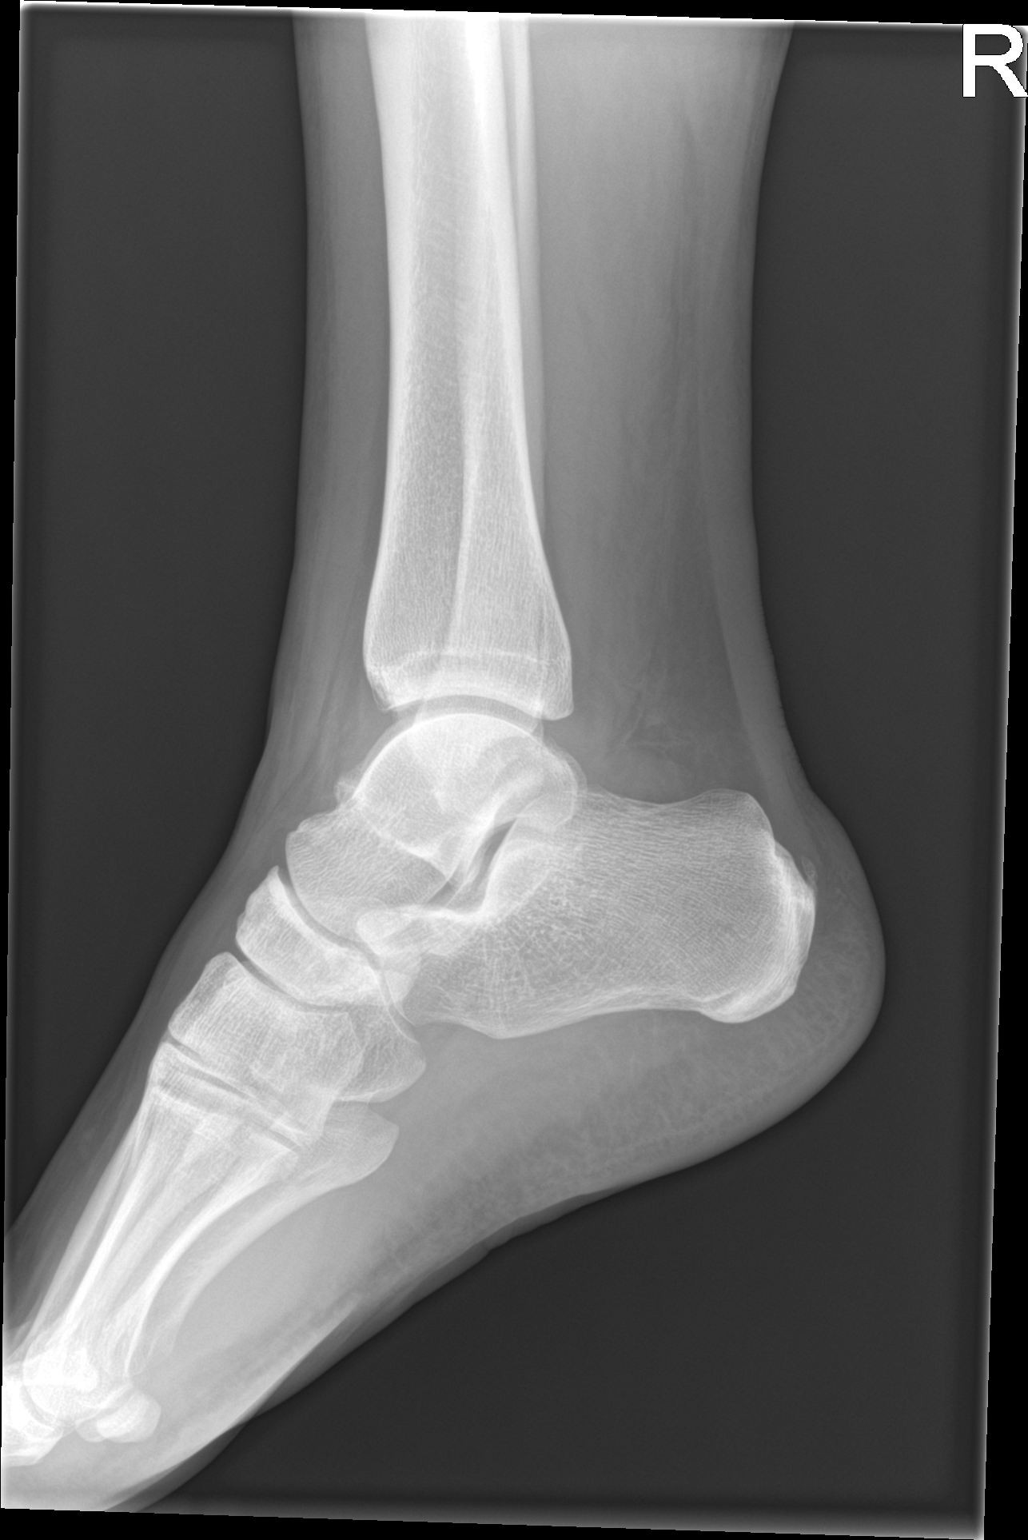

[3 of 3 positions shown; findings below may reference images not displayed]

FINDINGS: Mild soft tissue swelling overlying the lateral malleolus. No
fracture or acute bony findings. Plafond and talar dome intact.
Small Achilles calcaneal spur.
IMPRESSION: 1. Soft tissue swelling overlying the lateral malleolus, but without
a discrete fracture identified. If pain persists despite
conservative therapy, MRI may be warranted for further
characterization.

## 2021-06-28 ENCOUNTER — Other Ambulatory Visit: Payer: Self-pay

## 2021-06-28 ENCOUNTER — Emergency Department (HOSPITAL_COMMUNITY)
Admission: EM | Admit: 2021-06-28 | Discharge: 2021-06-28 | Disposition: A | Discharge status: Home / Self Care | Payer: PRIVATE HEALTH INSURANCE | Attending: Emergency Medicine | Admitting: Emergency Medicine

## 2021-06-28 ENCOUNTER — Emergency Department (HOSPITAL_COMMUNITY): Payer: PRIVATE HEALTH INSURANCE

## 2021-06-28 DIAGNOSIS — J209 Acute bronchitis, unspecified: Secondary | ICD-10-CM | POA: Insufficient documentation

## 2021-06-28 DIAGNOSIS — Z20822 Contact with and (suspected) exposure to covid-19: Secondary | ICD-10-CM | POA: Insufficient documentation

## 2021-06-28 DIAGNOSIS — R030 Elevated blood-pressure reading, without diagnosis of hypertension: Secondary | ICD-10-CM | POA: Insufficient documentation

## 2021-06-28 LAB — RESP PANEL BY RT-PCR (FLU A&B, COVID) ARPGX2
Influenza A by PCR: NEGATIVE
Influenza B by PCR: NEGATIVE
SARS Coronavirus 2 by RT PCR: NEGATIVE

## 2021-06-28 MED ORDER — AZITHROMYCIN 250 MG PO TABS: ORAL_TABLET | ORAL | 0 refills | Status: DC

## 2021-06-28 MED ORDER — IBUPROFEN 400 MG PO TABS
600.0000 mg | ORAL_TABLET | Freq: Once | ORAL | Status: AC
  Administered 2021-06-28: 600 mg via ORAL
  Filled 2021-06-28: qty 1

## 2021-06-28 MED ORDER — DOXYCYCLINE HYCLATE 100 MG PO CAPS: 100.0000 mg | ORAL_CAPSULE | Freq: Two times a day (BID) | ORAL | 0 refills | Status: AC

## 2021-06-28 NOTE — ED Notes (Signed)
Dr. Jodi Mourning requested 600 mg Ibuprofen for pt at the bedside.

## 2021-06-28 NOTE — Discharge Instructions (Addendum)
Take Tylenol every 4 hours and ibuprofen every 6 hours as needed for body aches and fevers. Use antibiotics as prescribed, if you have no allergies fill the azithromycin.  Stop taking if you develop any allergic reactions. Follow-up with primary doctor and have blood pressure rechecked when you are feeling better.

## 2021-06-28 NOTE — ED Triage Notes (Signed)
Pt with three weeks of nasal congestion, sore throat, cough, and horse voice. Negative covid test.

## 2021-06-28 NOTE — ED Provider Notes (Signed)
[facility] EMERGENCY DEPARTMENT Provider Note   CSN: [id number] Arrival date & time: 06/28/21  1202     History Chief Complaint  Patient presents with   Nasal Congestion    Katie Mcdonald is a 45 y.o. female.  Patient presents with 3 weeks of gradually worsening cough congestion sore throat fevers and hoarse voice.  Negative home COVID test.  Patient's had history of pneumonia in the past.  No diagnosis of high blood pressure.  No current antibiotics.  No shortness of breath.  No urinary symptoms.  No recent travel.  Patient smokes cigarettes.      No past medical history on file.  There are no problems to display for this patient.   No past surgical history on file.   OB History   No obstetric history on file.     No family history on file.  Social History   Tobacco Use   Smoking status: Every Day    Home Medications Prior to Admission medications   Medication Sig Start Date End Date Taking? Authorizing Provider  doxycycline (VIBRAMYCIN) 100 MG capsule Take 1 capsule (100 mg total) by mouth 2 (two) times daily. One po bid x 7 days 06/28/21  Yes Blane Ohara, MD  CALCIUM PO Take 1 tablet by mouth 2 (two) times daily.    [provider]  cyclobenzaprine (FLEXERIL) 10 MG tablet Take 1 tablet (10 mg total) by mouth 2 (two) times daily as needed for muscle spasms. 03/24/19   Fayrene Helper, PA-C  guaiFENesin-codeine 100-10 MG/5ML syrup Take 5 mLs by mouth 3 (three) times daily as needed for cough. 12/28/15   Ward, Chase Picket, PA-C  ibuprofen (ADVIL) 600 MG tablet Take 1 tablet (600 mg total) by mouth every 6 (six) hours as needed. 03/24/19   Fayrene Helper, PA-C  levofloxacin (LEVAQUIN) 750 MG tablet Take 1 tablet (750 mg total) by mouth daily. 12/28/15   Ward, Chase Picket, PA-C  Multiple Vitamin (MULTIVITAMIN WITH MINERALS) TABS tablet Take 1 tablet by mouth daily.    [provider]    Allergies    Penicillins and Onion  Review  of Systems   Review of Systems  Constitutional:  Positive for chills and fever.  HENT:  Positive for congestion.   Eyes:  Negative for visual disturbance.  Respiratory:  Positive for cough. Negative for shortness of breath.   Cardiovascular:  Negative for chest pain.  Gastrointestinal:  Negative for abdominal pain and vomiting.  Genitourinary:  Negative for dysuria and flank pain.  Musculoskeletal:  Negative for back pain, neck pain and neck stiffness.  Skin:  Negative for rash.  Neurological:  Negative for light-headedness and headaches.   Physical Exam Updated Vital Signs BP 123/71 (BP Location: Right Arm)   Pulse 62   Temp 98.5 F (36.9 C) (Oral)   Resp 14   LMP 06/14/2021   SpO2 100%   Physical Exam Vitals and nursing note reviewed.  Constitutional:      General: She is not in acute distress.    Appearance: She is well-developed.  HENT:     Head: Normocephalic and atraumatic.     Nose: Congestion present.     Mouth/Throat:     Mouth: Mucous membranes are moist.  Eyes:     General:        Right eye: No discharge.        Left eye: No discharge.     Conjunctiva/sclera: Conjunctivae normal.  Neck:  Trachea: No tracheal deviation.  Cardiovascular:     Rate and Rhythm: Normal rate and regular rhythm.     Heart sounds: No murmur heard. Pulmonary:     Effort: Pulmonary effort is normal.     Breath sounds: Normal breath sounds.  Abdominal:     General: There is no distension.     Palpations: Abdomen is soft.     Tenderness: There is no abdominal tenderness. There is no guarding.  Musculoskeletal:     Cervical back: Normal range of motion and neck supple. No rigidity.  Skin:    General: Skin is warm.     Capillary Refill: Capillary refill takes less than 2 seconds.     Findings: No rash.  Neurological:     General: No focal deficit present.     Mental Status: She is alert.     Cranial Nerves: No cranial nerve deficit.  Psychiatric:        Mood and Affect:  Mood normal.    ED Results / Procedures / Treatments   Labs (all labs ordered are listed, but only abnormal results are displayed) Labs Reviewed  RESP PANEL BY RT-PCR (FLU A&B, COVID) [id number]    EKG None  Radiology DG Chest 2 View  Result Date: 06/28/2021 CLINICAL DATA:  Cough and sore throat. EXAM: CHEST - 2 VIEW COMPARISON:  12/28/2015 FINDINGS: The heart size and mediastinal contours are within normal limits. Both lungs are clear. The visualized skeletal structures are unremarkable. IMPRESSION: No active cardiopulmonary disease. Electronically Signed   By: Signa Kell M.D.   On: 06/28/2021 13:21    Procedures Procedures   Medications Ordered in ED Medications  ibuprofen (ADVIL) tablet 600 mg (600 mg Oral Given 06/28/21 1538)    ED Course  I have reviewed the triage vital signs and the nursing notes.  Pertinent labs & imaging results that were available during my care of the patient were reviewed by me and considered in my medical decision making (see chart for details).    MDM Rules/Calculators/A&P                           Patient presents with clinical concern for respiratory infection gradually worsening.  With recurrent fevers and prolonged symptoms 3 weeks discussed differential including viral, recurrent viral infection, secondary bacterial infection, atypical pneumonia, sinus infection, other.  With recurrent symptoms, productive cough and recurrent fevers plan to try doxycycline with close outpatient follow-up.  Patient will follow-up for blood pressure recheck as well.  Chest x-ray reviewed no acute abnormalities, COVID and flu test reviewed negative.  Final Clinical Impression(s) / ED Diagnoses Final diagnoses:  Acute bronchitis, unspecified organism  Elevated blood pressure reading    Rx / DC Orders ED Discharge Orders          Ordered    azithromycin (ZITHROMAX Z-PAK) 250 MG tablet  Status:  Discontinued        06/28/21 1609    doxycycline  (VIBRAMYCIN) 100 MG capsule  2 times daily        06/28/21 1614             Blane Ohara, MD 06/28/21 1639

## 2022-05-21 IMAGING — DX DG CHEST 2V
2 series · 2 of 2 positions shown · non-contrast
Comparison: 12/28/2015

CLINICAL DATA: Cough and sore throat.

EXAM:
CHEST - 2 VIEW

[chest pa]
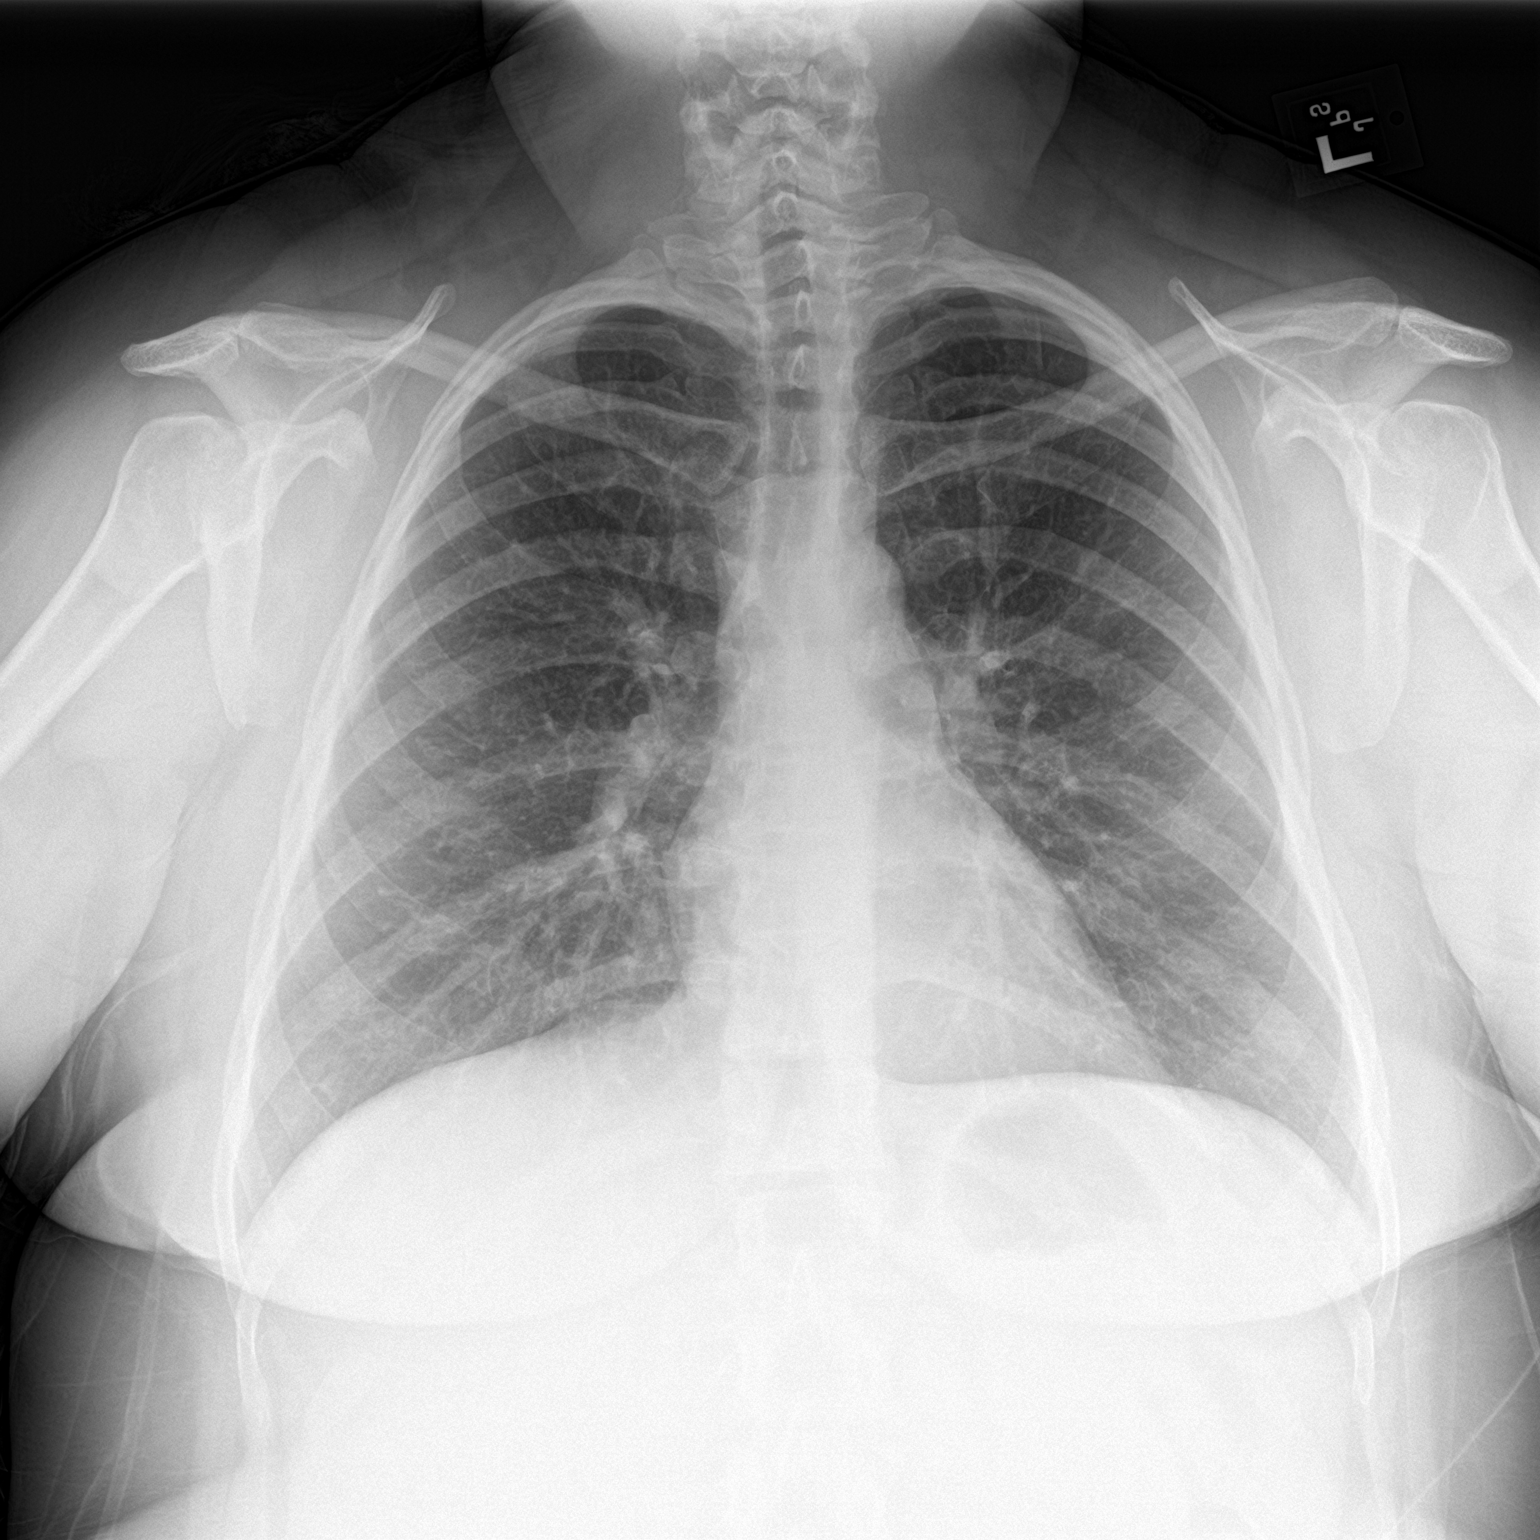

[chest lat]
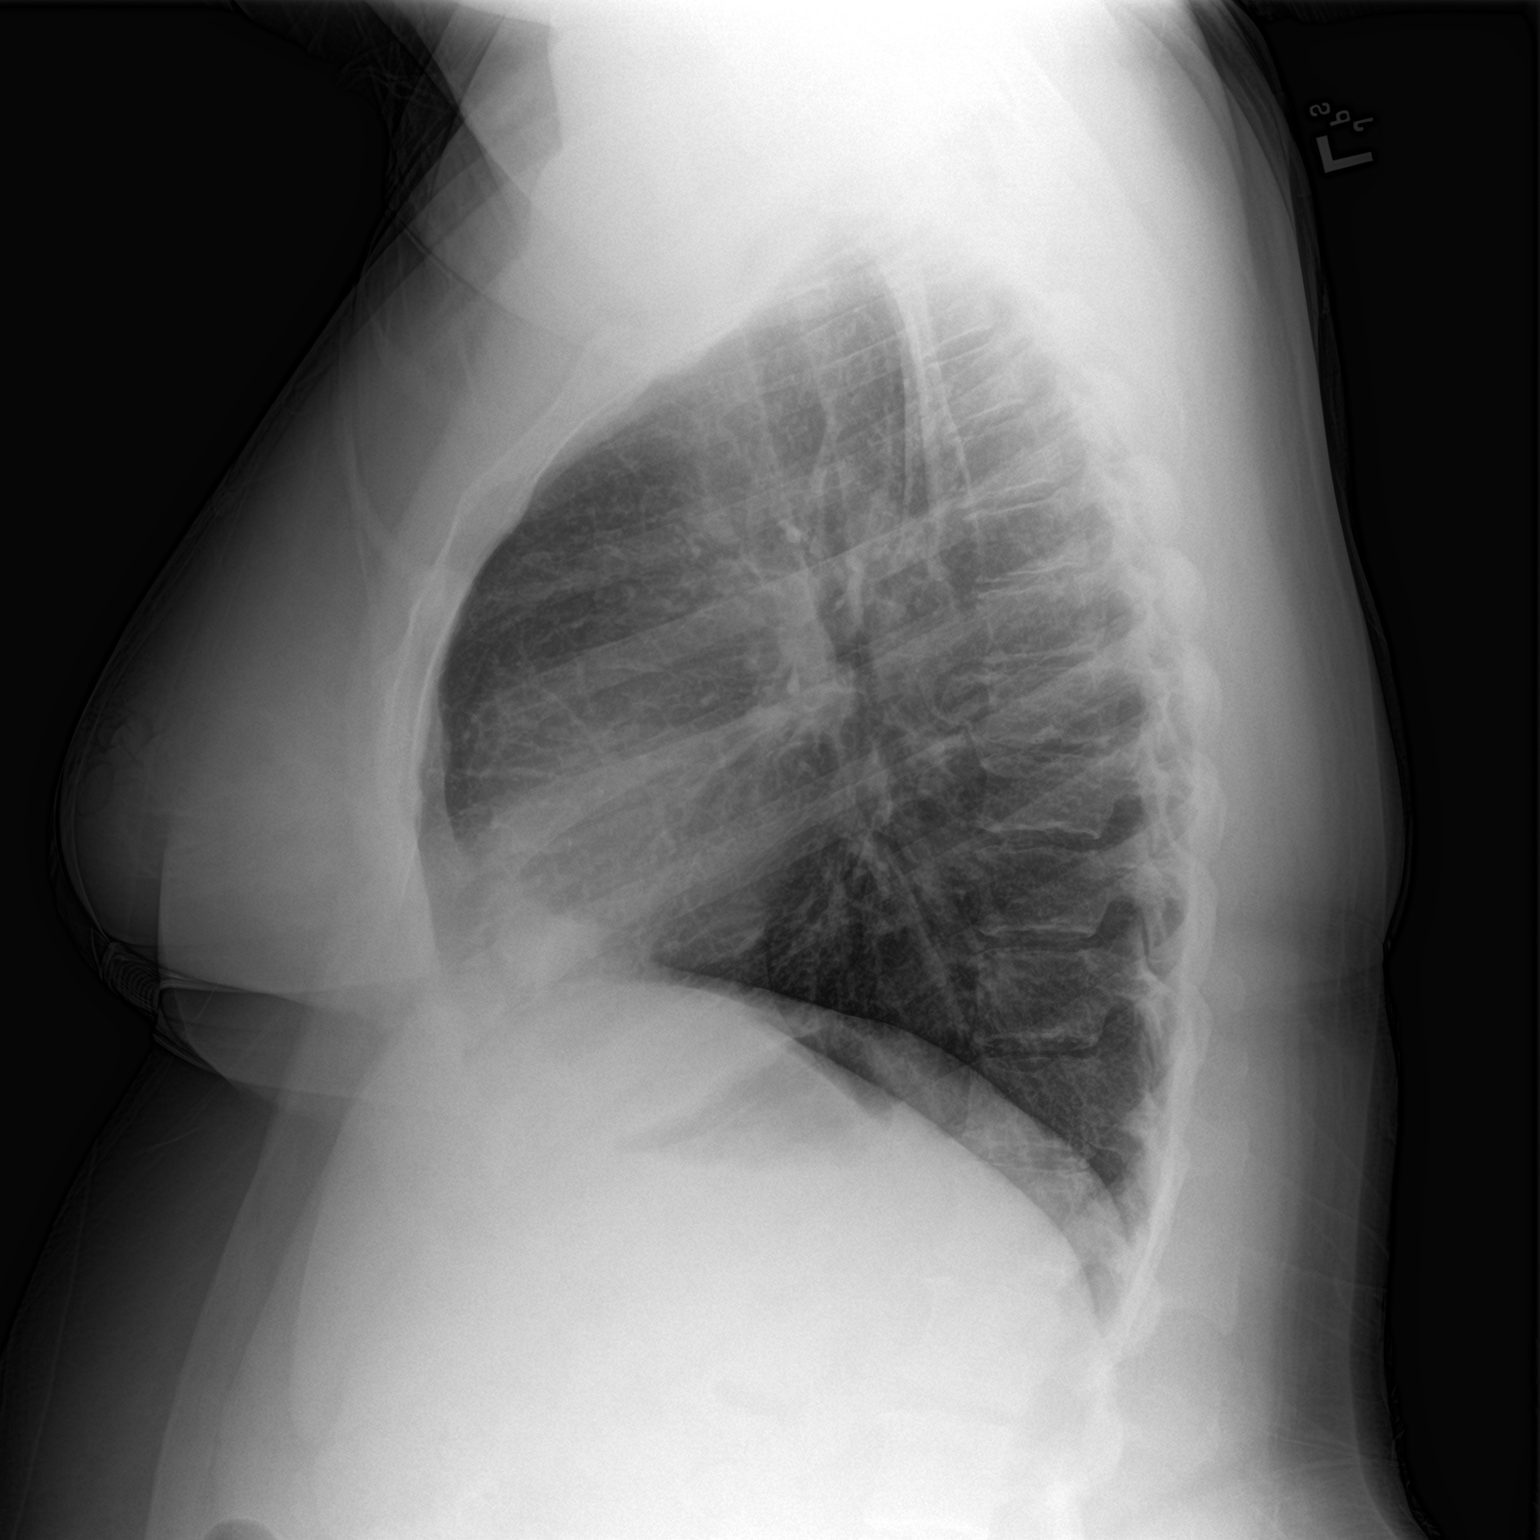

[2 of 2 positions shown; findings below may reference images not displayed]

FINDINGS: The heart size and mediastinal contours are within normal limits.
Both lungs are clear. The visualized skeletal structures are
unremarkable.
IMPRESSION: No active cardiopulmonary disease.

## 2023-03-23 ENCOUNTER — Ambulatory Visit (HOSPITAL_BASED_OUTPATIENT_CLINIC_OR_DEPARTMENT_OTHER): Payer: Medicaid Other | Admitting: Family Medicine

## 2023-03-30 ENCOUNTER — Encounter (HOSPITAL_COMMUNITY): Payer: Self-pay | Admitting: *Deleted

## 2023-03-30 ENCOUNTER — Inpatient Hospital Stay (HOSPITAL_COMMUNITY)
Admission: AD | Admit: 2023-03-30 | Discharge: 2023-03-30 | Discharge status: Against Medical Advice | Payer: Self-pay | Attending: Obstetrics and Gynecology | Admitting: Obstetrics and Gynecology

## 2023-03-30 DIAGNOSIS — Z5321 Procedure and treatment not carried out due to patient leaving prior to being seen by health care provider: Secondary | ICD-10-CM | POA: Insufficient documentation

## 2023-03-30 DIAGNOSIS — Z3202 Encounter for pregnancy test, result negative: Secondary | ICD-10-CM | POA: Insufficient documentation

## 2023-03-30 DIAGNOSIS — N912 Amenorrhea, unspecified: Secondary | ICD-10-CM | POA: Insufficient documentation

## 2023-03-30 LAB — URINALYSIS, ROUTINE W REFLEX MICROSCOPIC
Bilirubin Urine: NEGATIVE
Glucose, UA: >=500 mg/dL — AB
Hgb urine dipstick: NEGATIVE
Ketones, ur: NEGATIVE mg/dL
Nitrite: NEGATIVE
Protein, ur: NEGATIVE mg/dL
Specific Gravity, Urine: 1.029 (ref 1.005–1.030)
pH: 7 (ref 5.0–8.0)

## 2023-03-30 LAB — POCT PREGNANCY, URINE: Preg Test, Ur: NEGATIVE

## 2023-03-30 NOTE — MAU Note (Signed)
Katie Mcdonald is a 47 y.o. at Unknown here in MAU reporting: is having all the symptoms of being pregnant, but is testing neg.  Bloating, missed period, tender breasts. little nausea.  Frequent urination. WUJ:[id number] of June. No pain, no bleeding.. IUD was placed after the delivery of last child, she is 4. Onset of complaint: couple wks Pain score: none Vitals:   03/30/23 1423  BP: (!) 150/81  Pulse: 73  Resp: 17  Temp: 99 F (37.2 C)  SpO2: 97%      Lab orders placed from triage:  upt   Couldn't feel IUD strings a couple days ago, doesn't remember the last time she had checked.  Has not seen GYN in a long time.

## 2023-03-30 NOTE — MAU Note (Signed)
Pt. Left AMA @ 317pm

## 2024-04-11 ENCOUNTER — Ambulatory Visit
Admission: EM | Admit: 2024-04-11 | Discharge: 2024-04-11 | Disposition: A | Discharge status: Home / Self Care | Attending: Family Medicine | Admitting: Family Medicine

## 2024-04-11 ENCOUNTER — Ambulatory Visit (INDEPENDENT_AMBULATORY_CARE_PROVIDER_SITE_OTHER)

## 2024-04-11 ENCOUNTER — Ambulatory Visit: Payer: Self-pay | Admitting: Urgent Care

## 2024-04-11 DIAGNOSIS — M1711 Unilateral primary osteoarthritis, right knee: Secondary | ICD-10-CM | POA: Diagnosis not present

## 2024-04-11 DIAGNOSIS — M25561 Pain in right knee: Secondary | ICD-10-CM | POA: Diagnosis not present

## 2024-04-11 DIAGNOSIS — S86911A Strain of unspecified muscle(s) and tendon(s) at lower leg level, right leg, initial encounter: Secondary | ICD-10-CM | POA: Diagnosis not present

## 2024-04-11 MED ORDER — MELOXICAM 15 MG PO TABS: 15.0000 mg | ORAL_TABLET | Freq: Every day | ORAL | 0 refills | Status: AC

## 2024-04-11 NOTE — Discharge Instructions (Addendum)
 You should ice for 20 minutes every 2 hours as your schedule allows. Use the Ace wrap while awake, not in your sleep. Use meloxicam  for pain and inflammation. Do not use any other NSAID like Advil , Aleve, naproxen, ibuprofen . Follow up with the orthopedist as soon as possible.

## 2024-04-11 NOTE — ED Provider Notes (Signed)
 [facility]  Note:  This document was prepared using Conservation officer, historic buildings and may include unintentional dictation errors.  MRN: [id number] DOB: 08-31-1976  Subjective:   Katie Mcdonald is a 48 y.o. female presenting for 1 day history of severe right knee pain.  Symptoms started when she twisted her knee getting out of the car.  Patient was able to go to work, stands or walks the majority of her shift.  Also climbs stairs throughout her work shift.  Patient is able to bear weight, in clinic requested wheelchair to avoid bearing weight. Has used Aleve without relief. No history of heart disease, CKD, liver disease.   No current facility-administered medications for this encounter.  Current Outpatient Medications:    cetirizine (ZYRTEC) 10 MG tablet, Take 10 mg by mouth daily., Disp: , Rfl:    naproxen sodium (ALEVE) 220 MG tablet, Take 220 mg by mouth., Disp: , Rfl:    CALCIUM PO, Take 1 tablet by mouth 2 (two) times daily., Disp: , Rfl:    cyclobenzaprine  (FLEXERIL ) 10 MG tablet, Take 1 tablet (10 mg total) by mouth 2 (two) times daily as needed for muscle spasms., Disp: 20 tablet, Rfl: 0   doxycycline  (VIBRAMYCIN ) 100 MG capsule, Take 1 capsule (100 mg total) by mouth 2 (two) times daily. One po bid x 7 days, Disp: 14 capsule, Rfl: 0   guaiFENesin -codeine  100-10 MG/5ML syrup, Take 5 mLs by mouth 3 (three) times daily as needed for cough., Disp: 120 mL, Rfl: 0   ibuprofen  (ADVIL ) 600 MG tablet, Take 1 tablet (600 mg total) by mouth every 6 (six) hours as needed., Disp: 30 tablet, Rfl: 0   levofloxacin  (LEVAQUIN ) 750 MG tablet, Take 1 tablet (750 mg total) by mouth daily., Disp: 4 tablet, Rfl: 0   Multiple Vitamin (MULTIVITAMIN WITH MINERALS) TABS tablet, Take 1 tablet by mouth daily., Disp: , Rfl:    Allergies  Allergen Reactions   Penicillins Other (See Comments)    Doesn't remember   Onion Rash    History reviewed. No pertinent past medical  history.   Past Surgical History:  Procedure Laterality Date   CYST REMOVAL LEG      History reviewed. No pertinent family history.  Social History   Tobacco Use   Smoking status: Every Day  Vaping Use   Vaping status: Never Used  Substance Use Topics   Alcohol use: Yes    Comment: Occa   Drug use: Never    ROS   Objective:   Vitals: BP (!) 152/80 (BP Location: Left Arm)   Pulse 68   Temp 98.2 F (36.8 C) (Oral)   Resp 18   LMP 04/08/2024 (Exact Date)   SpO2 97%   Physical Exam Constitutional:      General: She is not in acute distress.    Appearance: Normal appearance. She is well-developed. She is not ill-appearing, toxic-appearing or diaphoretic.  HENT:     Head: Normocephalic and atraumatic.     Nose: Nose normal.     Mouth/Throat:     Mouth: Mucous membranes are moist.  Eyes:     General: No scleral icterus.       Right eye: No discharge.        Left eye: No discharge.     Extraocular Movements: Extraocular movements intact.  Cardiovascular:     Rate and Rhythm: Normal rate.  Pulmonary:     Effort: Pulmonary effort is normal.  Musculoskeletal:  Right knee: No swelling, deformity, effusion, erythema, ecchymosis, lacerations or crepitus. Decreased range of motion. Tenderness present over the medial joint line and patellar tendon. No lateral joint line tenderness. Normal alignment and normal patellar mobility.     Comments: Extensive guarding of the right knee. Pain out of proportion to physical exam findings and mechanism of injury.   Skin:    General: Skin is warm and dry.  Neurological:     General: No focal deficit present.     Mental Status: She is alert and oriented to person, place, and time.  Psychiatric:        Mood and Affect: Mood normal.        Behavior: Behavior normal.    DG Knee Complete 4 Views Right Result Date: 04/11/2024 CLINICAL DATA:  Right knee pain. EXAM: RIGHT KNEE - COMPLETE 4+ VIEW COMPARISON:  None Available. FINDINGS:  No evidence for an acute fracture. No dislocation. No joint effusion. Loss of joint space noted medial compartment. No worrisome lytic or sclerotic osseous abnormality. IMPRESSION: Degenerative changes in the medial compartment. No acute bony findings. Electronically Signed   By: Camellia Candle M.D.   On: 04/11/2024 09:24    Assessment and Plan :   PDMP not reviewed this encounter.  1. Acute pain of right knee   2. Knee strain, right, initial encounter    X-ray over-read was pending at time of discharge, recommended follow up with only abnormal results. Otherwise will not call for negative over-read. Patient was in agreement. Will manage with RICE method, applied a 4 Ace wrap as above. Meloxicam  for pain and inflammation. Follow up with her orthopedist as soon as possible for a recheck. Counseled patient on potential for adverse effects with medications prescribed/recommended today, ER and return-to-clinic precautions discussed, patient verbalized understanding.    Christopher Savannah, [place] 04/11/24 [phone]

## 2024-04-11 NOTE — ED Triage Notes (Signed)
 Pt reports right knee pain since last night. Pain is worse when moves. Aleve gives no relief.

## 2024-04-12 DIAGNOSIS — M1711 Unilateral primary osteoarthritis, right knee: Secondary | ICD-10-CM | POA: Diagnosis not present

## 2024-04-29 DIAGNOSIS — M25561 Pain in right knee: Secondary | ICD-10-CM | POA: Diagnosis not present

## 2024-05-03 DIAGNOSIS — M25561 Pain in right knee: Secondary | ICD-10-CM | POA: Diagnosis not present

## 2024-05-03 DIAGNOSIS — M25562 Pain in left knee: Secondary | ICD-10-CM | POA: Diagnosis not present

## 2024-05-09 ENCOUNTER — Other Ambulatory Visit: Payer: Self-pay

## 2024-05-09 ENCOUNTER — Ambulatory Visit: Attending: Orthopedic Surgery

## 2024-05-09 DIAGNOSIS — M25561 Pain in right knee: Secondary | ICD-10-CM | POA: Insufficient documentation

## 2024-05-09 DIAGNOSIS — R2689 Other abnormalities of gait and mobility: Secondary | ICD-10-CM | POA: Diagnosis present

## 2024-05-09 DIAGNOSIS — M6281 Muscle weakness (generalized): Secondary | ICD-10-CM | POA: Diagnosis present

## 2024-05-09 NOTE — Therapy (Unsigned)
 OUTPATIENT PHYSICAL THERAPY LOWER EXTREMITY EVALUATION   Patient Name: Katie Mcdonald MRN: [id number] DOB:1975-12-17, 48 y.o., female Today's Date: 05/10/2024  END OF SESSION:  PT End of Session - 05/10/24 1036     Visit Number 1    Number of Visits 17    Date for PT Re-Evaluation 07/05/24    Authorization Type [employer]    PT Start Time 0800    PT Stop Time 0833    PT Time Calculation (min) 33 min    Activity Tolerance Patient tolerated treatment well    Behavior During Therapy [facility] for tasks assessed/performed          History reviewed. No pertinent past medical history. Past Surgical History:  Procedure Laterality Date   CYST REMOVAL LEG     There are no active problems to display for this patient.   PCP: No PCP  REFERRING PROVIDER: Josefina Chew, MD   REFERRING DIAG: [phone] (ICD-10-CM) - Right knee pain   THERAPY DIAG:  Acute pain of right knee  Muscle weakness (generalized)  Other abnormalities of gait and mobility  Rationale for Evaluation and Treatment: Rehabilitation  ONSET DATE: 04/11/2024  SUBJECTIVE:   SUBJECTIVE STATEMENT: Pt presents to PT with reports of 4 week hx of R knee pain with no trauma or MOI. Thinks it may be related to being on her feet a lot at work but is unsure. Has had a decent amount of swelling off and on over last few weeks. Stairs and squatting have been very painful. Was going to have MRI potentially if symptoms continue.   PERTINENT HISTORY: See PMH  PAIN:  Are you having pain?  Yes: NPRS scale: 5/10 Worst: 8/10 Pain location: right knee, right thigh Pain description: sharp, tight Aggravating factors: stairs, standing, squatting Relieving factors: rest, medication  PRECAUTIONS: None  RED FLAGS: None   WEIGHT BEARING RESTRICTIONS: No  FALLS:  Has patient fallen in last 6 months? No  LIVING ENVIRONMENT: Lives with: lives with their family Lives in: House/apartment  OCCUPATION: Gas station attendant  PLOF:  Independent  PATIENT GOALS: decrease knee pain, be able to go up/down stairs and ladders for work  NEXT MD VISIT: PRN  OBJECTIVE:  Note: Objective measures were completed at Evaluation unless otherwise noted.  DIAGNOSTIC FINDINGS: see imaging   PATIENT SURVEYS:  LEFS  Extreme difficulty/unable (0), Quite a bit of difficulty (1), Moderate difficulty (2), Little difficulty (3), No difficulty (4) Survey date:  05/09/2024  Any of your usual work, housework or school activities 2  2. Usual hobbies, recreational or sporting activities 1  3. Getting into/out of the bath 3  4. Walking between rooms 3  5. Putting on socks/shoes 2  6. Squatting  1  7. Lifting an object, like a bag of groceries from the floor 3  8. Performing light activities around your home 2  9. Performing heavy activities around your home 2  10. Getting into/out of a car 0  11. Walking 2 blocks 0  12. Walking 1 mile 0  13. Going up/down 10 stairs (1 flight) 1  14. Standing for 1 hour 2  15.  sitting for 1 hour 4  16. Running on even ground 2  17. Running on uneven ground 1  18. Making sharp turns while running fast 1  19. Hopping  0  20. Rolling over in bed 2  Score total:  35/80     COGNITION: Overall cognitive status: Within functional limits for tasks assessed  SENSATION: WFL  POSTURE: rounded shoulders and weight shift left  PALPATION: TTP to distal R quad  LOWER EXTREMITY ROM:  Active ROM Right eval Left eval  Hip flexion    Hip extension    Hip abduction    Hip adduction    Hip internal rotation    Hip external rotation    Knee flexion 95   Knee extension 6   Ankle dorsiflexion    Ankle plantarflexion    Ankle inversion    Ankle eversion     (Blank rows = not tested)  LOWER EXTREMITY MMT:  MMT Right eval Left eval  Hip flexion 3 4  Hip extension    Hip abduction    Hip adduction    Hip internal rotation    Hip external rotation    Knee flexion 2+ 5  Knee extension 2+ 5   Ankle dorsiflexion    Ankle plantarflexion    Ankle inversion    Ankle eversion     (Blank rows = not tested)  LOWER EXTREMITY SPECIAL TESTS:  Knee special tests: Anterior drawer test: negative, Posterior drawer test: negative, and Lachman Test: negative  FUNCTIONAL TESTS:  30 Second Sit to Stand: 4 reps with UE  GAIT: Distance walked: 73ft Assistive device utilized: None Level of assistance: Complete Independence Comments: antalgic gait R  TREATMENT: [facility] Adult PT Treatment:                                                DATE: 05/09/2024 Therapeutic Exercise: Supine heel slide x 5 - 5 hold R Supine QS x 5 - 5 hold Seated clamshell x 10 GTB  PATIENT EDUCATION:  Education details: eval findings, LEFS, HEP, POC Person educated: Patient Education method: Explanation, Demonstration, and Handouts Education comprehension: verbalized understanding and returned demonstration  HOME EXERCISE PROGRAM: Access Code: [email or web address] URL: [email or web address] Date: 05/09/2024 Prepared by: Alm Kingdom  Exercises - Supine Heel Slide with Strap  - 2 x daily - 7 x weekly - 2 sets - 10 reps - 5 sec hold - Supine Quad Set (Mirrored)  - 2 x daily - 7 x weekly - 2 sets - 10 reps - 5 sec hold - Seated Hip Abduction with Resistance  - 2 x daily - 7 x weekly - 3 sets - 10 reps - green band hold  ASSESSMENT:  CLINICAL IMPRESSION: Patient is a 48 y.o. F who was seen today for physical therapy evaluation and treatment for acute R knee pain. Physical findings are consistent with MD impression as pt demonstrates significant decrease in R knee strength and ROM as well as decrease in functional mobility. LEFS score demonstrates severe disability in performance of home ADLs and higher level activities. She would benefit from skilled PT services working to improve quad and hip strength and overall mobility and ROM in order to improve function and decrease pain.    OBJECTIVE IMPAIRMENTS:  decreased activity tolerance, decreased balance, decreased mobility, difficulty walking, decreased ROM, decreased strength, and pain  ACTIVITY LIMITATIONS: carrying, lifting, standing, squatting, stairs, transfers, and locomotion level  PARTICIPATION LIMITATIONS: driving, shopping, community activity, occupation, and yard work  PERSONAL FACTORS: Time since onset of injury/illness/exacerbation are also affecting patient's functional outcome.   REHAB POTENTIAL: Good  CLINICAL DECISION MAKING: Stable/uncomplicated  EVALUATION COMPLEXITY: Low   GOALS: Goals reviewed with patient? No  SHORT TERM  GOALS: Target date: 05/30/2024   Pt will be compliant and knowledgeable with initial HEP for improved comfort and carryover Baseline: initial HEP given  Goal status: INITIAL  2.  Pt will self report right knee pain no greater than 6/10 for improved comfort and functional ability Baseline: 10/10 at worst Goal status: INITIAL   LONG TERM GOALS: Target date: 07/05/2024   Pt will improve LEFS to no less than 46/80 as proxy for functional improvement with home ADLs and higher level community activity Baseline: 35/80 Goal status: INITIAL  2.  Pt will self report right knee pain no greater than 3/10 for improved comfort and functional ability Baseline: 8/10 at worst Goal status: INITIAL   3.  Pt will increase 30 Second Sit to Stand rep count to no less than 8 reps for improved balance, strength, and functional mobility Baseline: 4 reps with UE Goal status: INITIAL   4.  Pt will improve R knee ROM to range of 0-110 degrees for improved functional mobility and decreased pain Baseline: see chart Goal status: INITIAL  5.  Pt will improve R LE MMT to at least 4/5 for all tested motions for improved comfort and mobility Baseline: see chart Goal status: INITIAL    PLAN:  PT FREQUENCY: 2x/week  PT DURATION: 8 weeks  PLANNED INTERVENTIONS: 97164- PT Re-evaluation, 97110-Therapeutic  exercises, 97530- Therapeutic activity, [id number]- Neuromuscular re-education, 97535- Self Care, [id number]- Manual therapy, [id number]- Gait training, [phone]- Electrical stimulation (unattended), [id number]- Electrical stimulation (manual), 97016- Vasopneumatic device, 20560 (1-2 muscles), 20561 (3+ muscles)- Dry Needling, Cryotherapy, and Moist heat  PLAN FOR NEXT SESSION: assess HEP response, quad and hip strengthening    Alm JAYSON Kingdom, PT 05/10/2024, 10:37 AM

## 2024-05-14 ENCOUNTER — Ambulatory Visit

## 2024-05-14 DIAGNOSIS — M25561 Pain in right knee: Secondary | ICD-10-CM | POA: Diagnosis not present

## 2024-05-14 DIAGNOSIS — R2689 Other abnormalities of gait and mobility: Secondary | ICD-10-CM

## 2024-05-14 DIAGNOSIS — M6281 Muscle weakness (generalized): Secondary | ICD-10-CM

## 2024-05-14 NOTE — Therapy (Signed)
 OUTPATIENT PHYSICAL THERAPY TREATMENT   Patient Name: Katie UMEDA MRN: [id number] DOB:Apr 08, 1976, 48 y.o., female Today's Date: 05/14/2024  END OF SESSION:  PT End of Session - 05/14/24 1435     Visit Number 2    Number of Visits 17    Date for PT Re-Evaluation 07/05/24    Authorization Type M[facility]    PT Start Time 1445    Activity Tolerance Patient tolerated treatment well    Behavior During Therapy [facility] for tasks assessed/performed           History reviewed. No pertinent past medical history. Past Surgical History:  Procedure Laterality Date   CYST REMOVAL LEG     There are no active problems to display for this patient.   PCP: No PCP  REFERRING PROVIDER: Josefina Chew, MD   REFERRING DIAG: [phone] (ICD-10-CM) - Right knee pain   THERAPY DIAG:  Acute pain of right knee  Muscle weakness (generalized)  Other abnormalities of gait and mobility  Rationale for Evaluation and Treatment: Rehabilitation  ONSET DATE: 04/11/2024  SUBJECTIVE:   SUBJECTIVE STATEMENT: Pt presents to PT with reports of 6/10 pain in R knee. Has been compliant with HEP but notes some exercises increase pain.   EVAL: Pt presents to PT with reports of 4 week hx of R knee pain with no trauma or MOI. Thinks it may be related to being on her feet a lot at work but is unsure. Has had a decent amount of swelling off and on over last few weeks. Stairs and squatting have been very painful. Was going to have MRI potentially if symptoms continue.   PERTINENT HISTORY: See PMH  PAIN:  Are you having pain?  Yes: NPRS scale: 5/10 Worst: 8/10 Pain location: right knee, right thigh Pain description: sharp, tight Aggravating factors: stairs, standing, squatting Relieving factors: rest, medication  PRECAUTIONS: None  RED FLAGS: None   WEIGHT BEARING RESTRICTIONS: No  FALLS:  Has patient fallen in last 6 months? No  LIVING ENVIRONMENT: Lives with: lives with their family Lives in:  House/apartment  OCCUPATION: Gas station attendant  PLOF: Independent  PATIENT GOALS: decrease knee pain, be able to go up/down stairs and ladders for work  NEXT MD VISIT: PRN  OBJECTIVE:  Note: Objective measures were completed at Evaluation unless otherwise noted.  DIAGNOSTIC FINDINGS: see imaging   PATIENT SURVEYS:  LEFS  Extreme difficulty/unable (0), Quite a bit of difficulty (1), Moderate difficulty (2), Little difficulty (3), No difficulty (4) Survey date:  05/09/2024  Any of your usual work, housework or school activities 2  2. Usual hobbies, recreational or sporting activities 1  3. Getting into/out of the bath 3  4. Walking between rooms 3  5. Putting on socks/shoes 2  6. Squatting  1  7. Lifting an object, like a bag of groceries from the floor 3  8. Performing light activities around your home 2  9. Performing heavy activities around your home 2  10. Getting into/out of a car 0  11. Walking 2 blocks 0  12. Walking 1 mile 0  13. Going up/down 10 stairs (1 flight) 1  14. Standing for 1 hour 2  15.  sitting for 1 hour 4  16. Running on even ground 2  17. Running on uneven ground 1  18. Making sharp turns while running fast 1  19. Hopping  0  20. Rolling over in bed 2  Score total:  35/80     COGNITION: Overall  cognitive status: Within functional limits for tasks assessed     SENSATION: WFL  POSTURE: rounded shoulders and weight shift left  PALPATION: TTP to distal R quad  LOWER EXTREMITY ROM:  Active ROM Right eval Left eval  Hip flexion    Hip extension    Hip abduction    Hip adduction    Hip internal rotation    Hip external rotation    Knee flexion 95   Knee extension 6   Ankle dorsiflexion    Ankle plantarflexion    Ankle inversion    Ankle eversion     (Blank rows = not tested)  LOWER EXTREMITY MMT:  MMT Right eval Left eval  Hip flexion 3 4  Hip extension    Hip abduction    Hip adduction    Hip internal rotation    Hip  external rotation    Knee flexion 2+ 5  Knee extension 2+ 5  Ankle dorsiflexion    Ankle plantarflexion    Ankle inversion    Ankle eversion     (Blank rows = not tested)  LOWER EXTREMITY SPECIAL TESTS:  Knee special tests: Anterior drawer test: negative, Posterior drawer test: negative, and Lachman Test: negative  FUNCTIONAL TESTS:  30 Second Sit to Stand: 4 reps with UE  GAIT: Distance walked: 57ft Assistive device utilized: None Level of assistance: Complete Independence Comments: antalgic gait R  TREATMENT: [facility] Adult PT Treatment:                                                DATE: 05/14/2024 Supine QS 2x10 - 5 hold McConnell tape R  Supine SLR - increased pain  Seated hamstring stretch 2x30 R LAQ 3x5 R S/L hip abd 2x5 R  [facility] Adult PT Treatment:                                                DATE: 05/09/2024 Therapeutic Exercise: Supine heel slide x 5 - 5 hold R Supine QS x 5 - 5 hold Seated clamshell x 10 GTB  PATIENT EDUCATION:  Education details: eval findings, LEFS, HEP, POC Person educated: Patient Education method: Explanation, Demonstration, and Handouts Education comprehension: verbalized understanding and returned demonstration  HOME EXERCISE PROGRAM: Access Code: [email or web address] URL: [email or web address] Date: 05/09/2024 Prepared by: Alm Kingdom  Exercises - Supine Heel Slide with Strap  - 2 x daily - 7 x weekly - 2 sets - 10 reps - 5 sec hold - Supine Quad Set (Mirrored)  - 2 x daily - 7 x weekly - 2 sets - 10 reps - 5 sec hold - Seated Hip Abduction with Resistance  - 2 x daily - 7 x weekly - 3 sets - 10 reps - green band hold  ASSESSMENT:  CLINICAL IMPRESSION: Pt tolerated treatment fair but was limited by severe R knee pain. Exercises focused   EVAL: Patient is a 48 y.o. F who was seen today for physical therapy evaluation and treatment for acute R knee pain. Physical findings are consistent with MD impression as pt demonstrates  significant decrease in R knee strength and ROM as well as decrease in functional mobility. LEFS score demonstrates severe disability in performance of home ADLs and  higher level activities. She would benefit from skilled PT services working to improve quad and hip strength and overall mobility and ROM in order to improve function and decrease pain.    OBJECTIVE IMPAIRMENTS: decreased activity tolerance, decreased balance, decreased mobility, difficulty walking, decreased ROM, decreased strength, and pain  ACTIVITY LIMITATIONS: carrying, lifting, standing, squatting, stairs, transfers, and locomotion level  PARTICIPATION LIMITATIONS: driving, shopping, community activity, occupation, and yard work  PERSONAL FACTORS: Time since onset of injury/illness/exacerbation are also affecting patient's functional outcome.   REHAB POTENTIAL: Good  CLINICAL DECISION MAKING: Stable/uncomplicated  EVALUATION COMPLEXITY: Low   GOALS: Goals reviewed with patient? No  SHORT TERM GOALS: Target date: 05/30/2024   Pt will be compliant and knowledgeable with initial HEP for improved comfort and carryover Baseline: initial HEP given  Goal status: INITIAL  2.  Pt will self report right knee pain no greater than 6/10 for improved comfort and functional ability Baseline: 10/10 at worst Goal status: INITIAL   LONG TERM GOALS: Target date: 07/05/2024   Pt will improve LEFS to no less than 46/80 as proxy for functional improvement with home ADLs and higher level community activity Baseline: 35/80 Goal status: INITIAL  2.  Pt will self report right knee pain no greater than 3/10 for improved comfort and functional ability Baseline: 8/10 at worst Goal status: INITIAL   3.  Pt will increase 30 Second Sit to Stand rep count to no less than 8 reps for improved balance, strength, and functional mobility Baseline: 4 reps with UE Goal status: INITIAL   4.  Pt will improve R knee ROM to range of 0-110 degrees  for improved functional mobility and decreased pain Baseline: see chart Goal status: INITIAL  5.  Pt will improve R LE MMT to at least 4/5 for all tested motions for improved comfort and mobility Baseline: see chart Goal status: INITIAL    PLAN:  PT FREQUENCY: 2x/week  PT DURATION: 8 weeks  PLANNED INTERVENTIONS: 97164- PT Re-evaluation, 97110-Therapeutic exercises, 97530- Therapeutic activity, [id number]- Neuromuscular re-education, 97535- Self Care, 02859- Manual therapy, [id number]- Gait training, [phone]- Electrical stimulation (unattended), [id number]- Electrical stimulation (manual), 97016- Vasopneumatic device, 20560 (1-2 muscles), 20561 (3+ muscles)- Dry Needling, Cryotherapy, and Moist heat  PLAN FOR NEXT SESSION: assess HEP response, quad and hip strengthening    Alm JAYSON Kingdom, PT 05/14/2024, 2:36 PM

## 2024-05-23 ENCOUNTER — Ambulatory Visit

## 2024-05-23 DIAGNOSIS — R2689 Other abnormalities of gait and mobility: Secondary | ICD-10-CM

## 2024-05-23 DIAGNOSIS — M25561 Pain in right knee: Secondary | ICD-10-CM | POA: Diagnosis not present

## 2024-05-23 DIAGNOSIS — M6281 Muscle weakness (generalized): Secondary | ICD-10-CM

## 2024-05-23 NOTE — Therapy (Signed)
 OUTPATIENT PHYSICAL THERAPY TREATMENT   Patient Name: Katie Mcdonald MRN: [id number] DOB:09-Jul-1976, 48 y.o., female Today's Date: 05/23/2024  END OF SESSION:  PT End of Session - 05/23/24 0921     Visit Number 3    Number of Visits 17    Date for Recertification  07/05/24    Authorization Type [employer]    PT Start Time 0930    PT Stop Time 1009    PT Time Calculation (min) 39 min    Activity Tolerance Patient tolerated treatment well    Behavior During Therapy [facility] for tasks assessed/performed            History reviewed. No pertinent past medical history. Past Surgical History:  Procedure Laterality Date   CYST REMOVAL LEG     There are no active problems to display for this patient.   PCP: No PCP  REFERRING PROVIDER: Josefina Chew, MD   REFERRING DIAG: [phone] (ICD-10-CM) - Right knee pain   THERAPY DIAG:  Acute pain of right knee  Muscle weakness (generalized)  Other abnormalities of gait and mobility  Rationale for Evaluation and Treatment: Rehabilitation  ONSET DATE: 04/11/2024  SUBJECTIVE:   SUBJECTIVE STATEMENT: Pt presents to PT with reports of decreased knee pain, has moved more towards the back of the knee at 4/10. Has been compliant with HEP.   EVAL: Pt presents to PT with reports of 4 week hx of R knee pain with no trauma or MOI. Thinks it may be related to being on her feet a lot at work but is unsure. Has had a decent amount of swelling off and on over last few weeks. Stairs and squatting have been very painful. Was going to have MRI potentially if symptoms continue.   PERTINENT HISTORY: See PMH  PAIN:  Are you having pain?  Yes: NPRS scale: 5/10 Worst: 8/10 Pain location: right knee, right thigh Pain description: sharp, tight Aggravating factors: stairs, standing, squatting Relieving factors: rest, medication  PRECAUTIONS: None  RED FLAGS: None   WEIGHT BEARING RESTRICTIONS: No  FALLS:  Has patient fallen in last 6 months?  No  LIVING ENVIRONMENT: Lives with: lives with their family Lives in: House/apartment  OCCUPATION: Gas station attendant  PLOF: Independent  PATIENT GOALS: decrease knee pain, be able to go up/down stairs and ladders for work  NEXT MD VISIT: PRN  OBJECTIVE:  Note: Objective measures were completed at Evaluation unless otherwise noted.  DIAGNOSTIC FINDINGS: see imaging   PATIENT SURVEYS:  LEFS  Extreme difficulty/unable (0), Quite a bit of difficulty (1), Moderate difficulty (2), Little difficulty (3), No difficulty (4) Survey date:  05/09/2024  Any of your usual work, housework or school activities 2  2. Usual hobbies, recreational or sporting activities 1  3. Getting into/out of the bath 3  4. Walking between rooms 3  5. Putting on socks/shoes 2  6. Squatting  1  7. Lifting an object, like a bag of groceries from the floor 3  8. Performing light activities around your home 2  9. Performing heavy activities around your home 2  10. Getting into/out of a car 0  11. Walking 2 blocks 0  12. Walking 1 mile 0  13. Going up/down 10 stairs (1 flight) 1  14. Standing for 1 hour 2  15.  sitting for 1 hour 4  16. Running on even ground 2  17. Running on uneven ground 1  18. Making sharp turns while running fast 1  19. Hopping  0  20. Rolling over in bed 2  Score total:  35/80     COGNITION: Overall cognitive status: Within functional limits for tasks assessed     SENSATION: WFL  POSTURE: rounded shoulders and weight shift left  PALPATION: TTP to distal R quad  LOWER EXTREMITY ROM:  Active ROM Right eval Right 05/23/24  Hip flexion    Hip extension    Hip abduction    Hip adduction    Hip internal rotation    Hip external rotation    Knee flexion 95 100  Knee extension 6   Ankle dorsiflexion    Ankle plantarflexion    Ankle inversion    Ankle eversion     (Blank rows = not tested)  LOWER EXTREMITY MMT:  MMT Right eval Left eval  Hip flexion 3 4  Hip  extension    Hip abduction    Hip adduction    Hip internal rotation    Hip external rotation    Knee flexion 2+ 5  Knee extension 2+ 5  Ankle dorsiflexion    Ankle plantarflexion    Ankle inversion    Ankle eversion     (Blank rows = not tested)  LOWER EXTREMITY SPECIAL TESTS:  Knee special tests: Anterior drawer test: negative, Posterior drawer test: negative, and Lachman Test: negative  FUNCTIONAL TESTS:  30 Second Sit to Stand: 4 reps with UE  GAIT: Distance walked: 68ft Assistive device utilized: None Level of assistance: Complete Independence Comments: antalgic gait R  TREATMENT: [facility] Adult PT Treatment:                                                DATE: 05/23/2024 Seated hamstring stretch 2x30 R Supine QS x 10 - 5 hold Supine SLR 2x5 R Hooklying clamshell 2x15 GTB S/L hip abd 2x5 R Supine heel slide 2x10 - 5 hold R Supine hamstring stretch 2x30 R LAQ 3x5 R  [facility] Adult PT Treatment:                                                DATE: 05/14/2024 Supine QS 2x10 - 5 hold McConnell tape R  Supine SLR - increased pain  Seated hamstring stretch 2x30 R LAQ 3x5 R S/L hip abd 2x5 R Modalities:  GameReady: Low compression 36 degrees 10 min  Right knee  [facility] Adult PT Treatment:                                                DATE: 05/09/2024 Therapeutic Exercise: Supine heel slide x 5 - 5 hold R Supine QS x 5 - 5 hold Seated clamshell x 10 GTB  PATIENT EDUCATION:  Education details: eval findings, LEFS, HEP, POC Person educated: Patient Education method: Explanation, Demonstration, and Handouts Education comprehension: verbalized understanding and returned demonstration  HOME EXERCISE PROGRAM: Access Code: [email or web address] URL: [email or web address] Date: 05/09/2024 Prepared by: Alm Kingdom  Exercises - Supine Heel Slide with Strap  - 2 x daily - 7 x weekly - 2 sets - 10 reps - 5 sec hold - Supine Quad  Set (Mirrored)  - 2 x daily - 7 x  weekly - 2 sets - 10 reps - 5 sec hold - Seated Hip Abduction with Resistance  - 2 x daily - 7 x weekly - 3 sets - 10 reps - green band hold  ASSESSMENT:  CLINICAL IMPRESSION: Pt tolerated treatment better today and was able to complete prescribed exercises despite discomfort. Demonstrates continued posterior-medial knee tightness and pain. Slightly improved quad contraction and knee ROM. Continues to require skilled PT services, will continue per POC.   EVAL: Patient is a 48 y.o. F who was seen today for physical therapy evaluation and treatment for acute R knee pain. Physical findings are consistent with MD impression as pt demonstrates significant decrease in R knee strength and ROM as well as decrease in functional mobility. LEFS score demonstrates severe disability in performance of home ADLs and higher level activities. She would benefit from skilled PT services working to improve quad and hip strength and overall mobility and ROM in order to improve function and decrease pain.    OBJECTIVE IMPAIRMENTS: decreased activity tolerance, decreased balance, decreased mobility, difficulty walking, decreased ROM, decreased strength, and pain  ACTIVITY LIMITATIONS: carrying, lifting, standing, squatting, stairs, transfers, and locomotion level  PARTICIPATION LIMITATIONS: driving, shopping, community activity, occupation, and yard work  PERSONAL FACTORS: Time since onset of injury/illness/exacerbation are also affecting patient's functional outcome.   REHAB POTENTIAL: Good  CLINICAL DECISION MAKING: Stable/uncomplicated  EVALUATION COMPLEXITY: Low   GOALS: Goals reviewed with patient? No  SHORT TERM GOALS: Target date: 05/30/2024   Pt will be compliant and knowledgeable with initial HEP for improved comfort and carryover Baseline: initial HEP given  Goal status: INITIAL  2.  Pt will self report right knee pain no greater than 6/10 for improved comfort and functional ability Baseline:  10/10 at worst Goal status: INITIAL   LONG TERM GOALS: Target date: 07/05/2024   Pt will improve LEFS to no less than 46/80 as proxy for functional improvement with home ADLs and higher level community activity Baseline: 35/80 Goal status: INITIAL  2.  Pt will self report right knee pain no greater than 3/10 for improved comfort and functional ability Baseline: 8/10 at worst Goal status: INITIAL   3.  Pt will increase 30 Second Sit to Stand rep count to no less than 8 reps for improved balance, strength, and functional mobility Baseline: 4 reps with UE Goal status: INITIAL   4.  Pt will improve R knee ROM to range of 0-110 degrees for improved functional mobility and decreased pain Baseline: see chart Goal status: INITIAL  5.  Pt will improve R LE MMT to at least 4/5 for all tested motions for improved comfort and mobility Baseline: see chart Goal status: INITIAL    PLAN:  PT FREQUENCY: 2x/week  PT DURATION: 8 weeks  PLANNED INTERVENTIONS: 97164- PT Re-evaluation, 97110-Therapeutic exercises, 97530- Therapeutic activity, [id number]- Neuromuscular re-education, 97535- Self Care, 02859- Manual therapy, [id number]- Gait training, [id number]- Electrical stimulation (unattended), [id number]- Electrical stimulation (manual), 97016- Vasopneumatic device, 20560 (1-2 muscles), 20561 (3+ muscles)- Dry Needling, Cryotherapy, and Moist heat  PLAN FOR NEXT SESSION: assess HEP response, quad and hip strengthening    Alm JAYSON Kingdom, PT 05/23/2024, 11:07 AM

## 2024-05-28 ENCOUNTER — Ambulatory Visit

## 2024-05-28 DIAGNOSIS — R2689 Other abnormalities of gait and mobility: Secondary | ICD-10-CM

## 2024-05-28 DIAGNOSIS — M6281 Muscle weakness (generalized): Secondary | ICD-10-CM

## 2024-05-28 DIAGNOSIS — M25561 Pain in right knee: Secondary | ICD-10-CM | POA: Diagnosis not present

## 2024-05-28 NOTE — Therapy (Signed)
 OUTPATIENT PHYSICAL THERAPY TREATMENT   Patient Name: Katie Mcdonald MRN: [id number] DOB:Mar 11, 1976, 48 y.o., female Today's Date: 05/28/2024  END OF SESSION:  PT End of Session - 05/28/24 1015     Visit Number 4    Number of Visits 17    Date for Recertification  07/05/24    Authorization Type [employer]    PT Start Time 1015    PT Stop Time 1055    PT Time Calculation (min) 40 min    Activity Tolerance Patient tolerated treatment well    Behavior During Therapy [facility] for tasks assessed/performed             History reviewed. No pertinent past medical history. Past Surgical History:  Procedure Laterality Date   CYST REMOVAL LEG     There are no active problems to display for this patient.   PCP: No PCP  REFERRING PROVIDER: Josefina Chew, MD   REFERRING DIAG: [phone] (ICD-10-CM) - Right knee pain   THERAPY DIAG:  Acute pain of right knee  Muscle weakness (generalized)  Other abnormalities of gait and mobility  Rationale for Evaluation and Treatment: Rehabilitation  ONSET DATE: 04/11/2024  SUBJECTIVE:   SUBJECTIVE STATEMENT: Pt presents to PT with reports of decreased pain since last session until her knee buckled while walking down the stairs this morning. 3/10 prior to buckling now 6/10.   EVAL: Pt presents to PT with reports of 4 week hx of R knee pain with no trauma or MOI. Thinks it may be related to being on her feet a lot at work but is unsure. Has had a decent amount of swelling off and on over last few weeks. Stairs and squatting have been very painful. Was going to have MRI potentially if symptoms continue.   PERTINENT HISTORY: See PMH  PAIN:  Are you having pain?  Yes: NPRS scale: 5/10 Worst: 8/10 Pain location: right knee, right thigh Pain description: sharp, tight Aggravating factors: stairs, standing, squatting Relieving factors: rest, medication  PRECAUTIONS: None  RED FLAGS: None   WEIGHT BEARING RESTRICTIONS: No  FALLS:  Has  patient fallen in last 6 months? No  LIVING ENVIRONMENT: Lives with: lives with their family Lives in: House/apartment  OCCUPATION: Gas station attendant  PLOF: Independent  PATIENT GOALS: decrease knee pain, be able to go up/down stairs and ladders for work  NEXT MD VISIT: PRN  OBJECTIVE:  Note: Objective measures were completed at Evaluation unless otherwise noted.  DIAGNOSTIC FINDINGS: see imaging   PATIENT SURVEYS:  LEFS  Extreme difficulty/unable (0), Quite a bit of difficulty (1), Moderate difficulty (2), Little difficulty (3), No difficulty (4) Survey date:  05/09/2024  Any of your usual work, housework or school activities 2  2. Usual hobbies, recreational or sporting activities 1  3. Getting into/out of the bath 3  4. Walking between rooms 3  5. Putting on socks/shoes 2  6. Squatting  1  7. Lifting an object, like a bag of groceries from the floor 3  8. Performing light activities around your home 2  9. Performing heavy activities around your home 2  10. Getting into/out of a car 0  11. Walking 2 blocks 0  12. Walking 1 mile 0  13. Going up/down 10 stairs (1 flight) 1  14. Standing for 1 hour 2  15.  sitting for 1 hour 4  16. Running on even ground 2  17. Running on uneven ground 1  18. Making sharp turns while running fast  1  19. Hopping  0  20. Rolling over in bed 2  Score total:  35/80     COGNITION: Overall cognitive status: Within functional limits for tasks assessed     SENSATION: WFL  POSTURE: rounded shoulders and weight shift left  PALPATION: TTP to distal R quad  LOWER EXTREMITY ROM:  Active ROM Right eval Right 05/23/24 Right 05/28/24  Hip flexion     Hip extension     Hip abduction     Hip adduction     Hip internal rotation     Hip external rotation     Knee flexion 95 100 90  Knee extension 6  4  Ankle dorsiflexion     Ankle plantarflexion     Ankle inversion     Ankle eversion      (Blank rows = not tested)  LOWER  EXTREMITY MMT:  MMT Right eval Left eval  Hip flexion 3 4  Hip extension    Hip abduction    Hip adduction    Hip internal rotation    Hip external rotation    Knee flexion 2+ 5  Knee extension 2+ 5  Ankle dorsiflexion    Ankle plantarflexion    Ankle inversion    Ankle eversion     (Blank rows = not tested)  LOWER EXTREMITY SPECIAL TESTS:  Knee special tests: Anterior drawer test: negative, Posterior drawer test: negative, and Lachman Test: negative  FUNCTIONAL TESTS:  30 Second Sit to Stand: 4 reps with UE  GAIT: Distance walked: 8ft Assistive device utilized: None Level of assistance: Complete Independence Comments: antalgic gait R  TREATMENT: [facility] Adult PT Treatment:                                                DATE: 05/28/2024 Seated hamstring stretch 2x30 R Supine QS x 10 - 5 hold Supine SLR 2x10 R Hooklying clamshell 2x15 GTB S/L hip abd 2x5 R Supine heel slide 2x10 - 5 hold R with strap Supine hamstring stretch 2x30 R Seated heel slide 2x10 R - 5 hold LAQ 2x5 R  [facility] Adult PT Treatment:                                                DATE: 05/23/2024 Seated hamstring stretch 2x30 R Supine QS x 10 - 5 hold Supine SLR 2x5 R Hooklying clamshell 2x15 GTB S/L hip abd 2x5 R Supine heel slide 2x10 - 5 hold R Supine hamstring stretch 2x30 R LAQ 3x5 R  [facility] Adult PT Treatment:                                                DATE: 05/14/2024 Supine QS 2x10 - 5 hold McConnell tape R  Supine SLR - increased pain  Seated hamstring stretch 2x30 R LAQ 3x5 R S/L hip abd 2x5 R Modalities:  GameReady: Low compression 36 degrees 10 min  Right knee  [facility] Adult PT Treatment:  DATE: 05/09/2024 Therapeutic Exercise: Supine heel slide x 5 - 5 hold R Supine QS x 5 - 5 hold Seated clamshell x 10 GTB  PATIENT EDUCATION:  Education details: eval findings, LEFS, HEP, POC Person educated: Patient Education  method: Explanation, Demonstration, and Handouts Education comprehension: verbalized understanding and returned demonstration  HOME EXERCISE PROGRAM: Access Code: [email or web address] URL: [email or web address] Date: 05/28/2024 Prepared by: Alm Kingdom  Exercises - Supine Heel Slide with Strap  - 2 x daily - 7 x weekly - 2 sets - 10 reps - 5 sec hold - Supine Quad Set (Mirrored)  - 2 x daily - 7 x weekly - 2 sets - 10 reps - 5 sec hold - Seated Hip Abduction with Resistance  - 2 x daily - 7 x weekly - 3 sets - 10 reps - green band hold - Seated Hamstring Stretch  - 1 x daily - 7 x weekly - 2 sets - 30 sec hold - Seated Long Arc Quad  - 1 x daily - 7 x weekly - 3 sets - 5 reps  ASSESSMENT:  CLINICAL IMPRESSION: Pt tolerated treatment fair but was more limited by pain today. Noticeable muscle bulk difference in R knee and distal LE compared to L. Continues to overall have significant deficits in R knee below PLOF. Waiting to hear back from MD office to schedule f/u. Will continue to progress R knee deficits per POC as tolerated.   EVAL: Patient is a 48 y.o. F who was seen today for physical therapy evaluation and treatment for acute R knee pain. Physical findings are consistent with MD impression as pt demonstrates significant decrease in R knee strength and ROM as well as decrease in functional mobility. LEFS score demonstrates severe disability in performance of home ADLs and higher level activities. She would benefit from skilled PT services working to improve quad and hip strength and overall mobility and ROM in order to improve function and decrease pain.    OBJECTIVE IMPAIRMENTS: decreased activity tolerance, decreased balance, decreased mobility, difficulty walking, decreased ROM, decreased strength, and pain  ACTIVITY LIMITATIONS: carrying, lifting, standing, squatting, stairs, transfers, and locomotion level  PARTICIPATION LIMITATIONS: driving, shopping, community activity,  occupation, and yard work  PERSONAL FACTORS: Time since onset of injury/illness/exacerbation are also affecting patient's functional outcome.   REHAB POTENTIAL: Good  CLINICAL DECISION MAKING: Stable/uncomplicated  EVALUATION COMPLEXITY: Low   GOALS: Goals reviewed with patient? No  SHORT TERM GOALS: Target date: 05/30/2024   Pt will be compliant and knowledgeable with initial HEP for improved comfort and carryover Baseline: initial HEP given  Goal status: INITIAL  2.  Pt will self report right knee pain no greater than 6/10 for improved comfort and functional ability Baseline: 10/10 at worst Goal status: INITIAL   LONG TERM GOALS: Target date: 07/05/2024   Pt will improve LEFS to no less than 46/80 as proxy for functional improvement with home ADLs and higher level community activity Baseline: 35/80 Goal status: INITIAL  2.  Pt will self report right knee pain no greater than 3/10 for improved comfort and functional ability Baseline: 8/10 at worst Goal status: INITIAL   3.  Pt will increase 30 Second Sit to Stand rep count to no less than 8 reps for improved balance, strength, and functional mobility Baseline: 4 reps with UE Goal status: INITIAL   4.  Pt will improve R knee ROM to range of 0-110 degrees for improved functional mobility and decreased pain Baseline: see chart Goal status: INITIAL  5.  Pt will improve R LE MMT to at least 4/5 for all tested motions for improved comfort and mobility Baseline: see chart Goal status: INITIAL    PLAN:  PT FREQUENCY: 2x/week  PT DURATION: 8 weeks  PLANNED INTERVENTIONS: 97164- PT Re-evaluation, 97110-Therapeutic exercises, 97530- Therapeutic activity, [id number]- Neuromuscular re-education, 97535- Self Care, 02859- Manual therapy, [id number]- Gait training, [phone]- Electrical stimulation (unattended), [id number]- Electrical stimulation (manual), 97016- Vasopneumatic device, 20560 (1-2 muscles), 20561 (3+ muscles)- Dry Needling,  Cryotherapy, and Moist heat  PLAN FOR NEXT SESSION: assess HEP response, quad and hip strengthening    Alm JAYSON Kingdom, PT 05/28/2024, 11:00 AM

## 2024-05-30 ENCOUNTER — Ambulatory Visit

## 2024-05-30 NOTE — Therapy (Incomplete)
 OUTPATIENT PHYSICAL THERAPY TREATMENT   Patient Name: Katie Mcdonald MRN: [id number] DOB:[birth year], 48 y.o., female Today's Date: 05/30/2024  END OF SESSION:       No past medical history on file. Past Surgical History:  Procedure Laterality Date   CYST REMOVAL LEG     There are no active problems to display for this patient.   PCP: No PCP  REFERRING PROVIDER: Josefina Chew, MD   REFERRING DIAG: [phone] (ICD-10-CM) - Right knee pain   THERAPY DIAG:  No diagnosis found.  Rationale for Evaluation and Treatment: Rehabilitation  ONSET DATE: 04/11/2024  SUBJECTIVE:   SUBJECTIVE STATEMENT: ***  EVAL: Pt presents to PT with reports of 4 week hx of R knee pain with no trauma or MOI. Thinks it may be related to being on her feet a lot at work but is unsure. Has had a decent amount of swelling off and on over last few weeks. Stairs and squatting have been very painful. Was going to have MRI potentially if symptoms continue.   PERTINENT HISTORY: See PMH  PAIN:  Are you having pain?  Yes: NPRS scale: 5/10 Worst: 8/10 Pain location: right knee, right thigh Pain description: sharp, tight Aggravating factors: stairs, standing, squatting Relieving factors: rest, medication  PRECAUTIONS: None  RED FLAGS: None   WEIGHT BEARING RESTRICTIONS: No  FALLS:  Has patient fallen in last 6 months? No  LIVING ENVIRONMENT: Lives with: lives with their family Lives in: House/apartment  OCCUPATION: Gas station attendant  PLOF: Independent  PATIENT GOALS: decrease knee pain, be able to go up/down stairs and ladders for work  NEXT MD VISIT: PRN  OBJECTIVE:  Note: Objective measures were completed at Evaluation unless otherwise noted.  DIAGNOSTIC FINDINGS: see imaging   PATIENT SURVEYS:  LEFS  Extreme difficulty/unable (0), Quite a bit of difficulty (1), Moderate difficulty (2), Little difficulty (3), No difficulty (4) Survey date:  05/09/2024  Any of your usual work,  housework or school activities 2  2. Usual hobbies, recreational or sporting activities 1  3. Getting into/out of the bath 3  4. Walking between rooms 3  5. Putting on socks/shoes 2  6. Squatting  1  7. Lifting an object, like a bag of groceries from the floor 3  8. Performing light activities around your home 2  9. Performing heavy activities around your home 2  10. Getting into/out of a car 0  11. Walking 2 blocks 0  12. Walking 1 mile 0  13. Going up/down 10 stairs (1 flight) 1  14. Standing for 1 hour 2  15.  sitting for 1 hour 4  16. Running on even ground 2  17. Running on uneven ground 1  18. Making sharp turns while running fast 1  19. Hopping  0  20. Rolling over in bed 2  Score total:  35/80     COGNITION: Overall cognitive status: Within functional limits for tasks assessed     SENSATION: WFL  POSTURE: rounded shoulders and weight shift left  PALPATION: TTP to distal R quad  LOWER EXTREMITY ROM:  Active ROM Right eval Right 05/23/24 Right 05/28/24  Hip flexion     Hip extension     Hip abduction     Hip adduction     Hip internal rotation     Hip external rotation     Knee flexion 95 100 90  Knee extension 6  4  Ankle dorsiflexion     Ankle plantarflexion  Ankle inversion     Ankle eversion      (Blank rows = not tested)  LOWER EXTREMITY MMT:  MMT Right eval Left eval  Hip flexion 3 4  Hip extension    Hip abduction    Hip adduction    Hip internal rotation    Hip external rotation    Knee flexion 2+ 5  Knee extension 2+ 5  Ankle dorsiflexion    Ankle plantarflexion    Ankle inversion    Ankle eversion     (Blank rows = not tested)  LOWER EXTREMITY SPECIAL TESTS:  Knee special tests: Anterior drawer test: negative, Posterior drawer test: negative, and Lachman Test: negative  FUNCTIONAL TESTS:  30 Second Sit to Stand: 4 reps with UE  GAIT: Distance walked: 33ft Assistive device utilized: None Level of assistance: Complete  Independence Comments: antalgic gait R  TREATMENT: [facility] Adult PT Treatment:                                                DATE: 05/30/2024 Seated hamstring stretch 2x30 R Supine QS x 10 - 5 hold Supine SLR 2x10 R Hooklying clamshell 2x15 GTB S/L hip abd 2x5 R Supine heel slide 2x10 - 5 hold R with strap Supine hamstring stretch 2x30 R Seated heel slide 2x10 R - 5 hold LAQ 2x5 R  [facility] Adult PT Treatment:                                                DATE: 05/28/2024 Seated hamstring stretch 2x30 R Supine QS x 10 - 5 hold Supine SLR 2x10 R Hooklying clamshell 2x15 GTB S/L hip abd 2x5 R Supine heel slide 2x10 - 5 hold R with strap Supine hamstring stretch 2x30 R Seated heel slide 2x10 R - 5 hold LAQ 2x5 R  [facility] Adult PT Treatment:                                                DATE: 05/23/2024 Seated hamstring stretch 2x30 R Supine QS x 10 - 5 hold Supine SLR 2x5 R Hooklying clamshell 2x15 GTB S/L hip abd 2x5 R Supine heel slide 2x10 - 5 hold R Supine hamstring stretch 2x30 R LAQ 3x5 R  [facility] Adult PT Treatment:                                                DATE: 05/14/2024 Supine QS 2x10 - 5 hold McConnell tape R  Supine SLR - increased pain  Seated hamstring stretch 2x30 R LAQ 3x5 R S/L hip abd 2x5 R Modalities:  GameReady: Low compression 36 degrees 10 min  Right knee  [facility] Adult PT Treatment:  DATE: 05/09/2024 Therapeutic Exercise: Supine heel slide x 5 - 5 hold R Supine QS x 5 - 5 hold Seated clamshell x 10 GTB  PATIENT EDUCATION:  Education details: eval findings, LEFS, HEP, POC Person educated: Patient Education method: Explanation, Demonstration, and Handouts Education comprehension: verbalized understanding and returned demonstration  HOME EXERCISE PROGRAM: Access Code: [email or web address] URL: [email or web address] Date: 05/28/2024 Prepared by: Alm Kingdom  Exercises - Supine  Heel Slide with Strap  - 2 x daily - 7 x weekly - 2 sets - 10 reps - 5 sec hold - Supine Quad Set (Mirrored)  - 2 x daily - 7 x weekly - 2 sets - 10 reps - 5 sec hold - Seated Hip Abduction with Resistance  - 2 x daily - 7 x weekly - 3 sets - 10 reps - green band hold - Seated Hamstring Stretch  - 1 x daily - 7 x weekly - 2 sets - 30 sec hold - Seated Long Arc Quad  - 1 x daily - 7 x weekly - 3 sets - 5 reps  ASSESSMENT:  CLINICAL IMPRESSION: ***   EVAL: Patient is a 48 y.o. F who was seen today for physical therapy evaluation and treatment for acute R knee pain. Physical findings are consistent with MD impression as pt demonstrates significant decrease in R knee strength and ROM as well as decrease in functional mobility. LEFS score demonstrates severe disability in performance of home ADLs and higher level activities. She would benefit from skilled PT services working to improve quad and hip strength and overall mobility and ROM in order to improve function and decrease pain.    OBJECTIVE IMPAIRMENTS: decreased activity tolerance, decreased balance, decreased mobility, difficulty walking, decreased ROM, decreased strength, and pain  ACTIVITY LIMITATIONS: carrying, lifting, standing, squatting, stairs, transfers, and locomotion level  PARTICIPATION LIMITATIONS: driving, shopping, community activity, occupation, and yard work  PERSONAL FACTORS: Time since onset of injury/illness/exacerbation are also affecting patient's functional outcome.   REHAB POTENTIAL: Good  CLINICAL DECISION MAKING: Stable/uncomplicated  EVALUATION COMPLEXITY: Low   GOALS: Goals reviewed with patient? No  SHORT TERM GOALS: Target date: 05/30/2024   Pt will be compliant and knowledgeable with initial HEP for improved comfort and carryover Baseline: initial HEP given  Goal status: INITIAL  2.  Pt will self report right knee pain no greater than 6/10 for improved comfort and functional ability Baseline: 10/10  at worst Goal status: INITIAL   LONG TERM GOALS: Target date: 07/05/2024   Pt will improve LEFS to no less than 46/80 as proxy for functional improvement with home ADLs and higher level community activity Baseline: 35/80 Goal status: INITIAL  2.  Pt will self report right knee pain no greater than 3/10 for improved comfort and functional ability Baseline: 8/10 at worst Goal status: INITIAL   3.  Pt will increase 30 Second Sit to Stand rep count to no less than 8 reps for improved balance, strength, and functional mobility Baseline: 4 reps with UE Goal status: INITIAL   4.  Pt will improve R knee ROM to range of 0-110 degrees for improved functional mobility and decreased pain Baseline: see chart Goal status: INITIAL  5.  Pt will improve R LE MMT to at least 4/5 for all tested motions for improved comfort and mobility Baseline: see chart Goal status: INITIAL    PLAN:  PT FREQUENCY: 2x/week  PT DURATION: 8 weeks  PLANNED INTERVENTIONS: 97164- PT Re-evaluation, 97110-Therapeutic exercises, 97530- Therapeutic activity, 97112- Neuromuscular re-education,  02464- Self Care, 02859- Manual therapy, [id number]- Gait training, [phone]- Electrical stimulation (unattended), [id number]- Electrical stimulation (manual), [id number]- Vasopneumatic device, [phone] (1-2 muscles), 20561 (3+ muscles)- Dry Needling, Cryotherapy, and Moist heat  PLAN FOR NEXT SESSION: assess HEP response, quad and hip strengthening    Alm JAYSON Kingdom, PT 05/30/2024, 8:13 AM

## 2024-06-04 ENCOUNTER — Ambulatory Visit

## 2024-06-04 DIAGNOSIS — M25561 Pain in right knee: Secondary | ICD-10-CM

## 2024-06-04 DIAGNOSIS — M6281 Muscle weakness (generalized): Secondary | ICD-10-CM

## 2024-06-04 DIAGNOSIS — R2689 Other abnormalities of gait and mobility: Secondary | ICD-10-CM

## 2024-06-04 NOTE — Therapy (Signed)
 OUTPATIENT PHYSICAL THERAPY TREATMENT   Patient Name: Katie Mcdonald MRN: [id number] DOB:December 31, 1975, 48 y.o., female Today's Date: 06/04/2024  END OF SESSION:  PT End of Session - 06/04/24 1010     Visit Number 5    Number of Visits 17    Date for Recertification  07/05/24    Authorization Type [employer]    PT Start Time 1015    PT Stop Time 1053    PT Time Calculation (min) 38 min    Activity Tolerance Patient tolerated treatment well    Behavior During Therapy [facility] for tasks assessed/performed              History reviewed. No pertinent past medical history. Past Surgical History:  Procedure Laterality Date   CYST REMOVAL LEG     There are no active problems to display for this patient.   PCP: No PCP  REFERRING PROVIDER: Josefina Chew, MD   REFERRING DIAG: [phone] (ICD-10-CM) - Right knee pain   THERAPY DIAG:  Acute pain of right knee  Muscle weakness (generalized)  Other abnormalities of gait and mobility  Rationale for Evaluation and Treatment: Rehabilitation  ONSET DATE: 04/11/2024  SUBJECTIVE:   SUBJECTIVE STATEMENT: Pt presents to PT with reports of muscle spasms but decreased knee pain compared to last session. Has bene compliant with HEP.  EVAL: Pt presents to PT with reports of 4 week hx of R knee pain with no trauma or MOI. Thinks it may be related to being on her feet a lot at work but is unsure. Has had a decent amount of swelling off and on over last few weeks. Stairs and squatting have been very painful. Was going to have MRI potentially if symptoms continue.   PERTINENT HISTORY: See PMH  PAIN:  Are you having pain?  Yes: NPRS scale: 5/10 Worst: 8/10 Pain location: right knee, right thigh Pain description: sharp, tight Aggravating factors: stairs, standing, squatting Relieving factors: rest, medication  PRECAUTIONS: None  RED FLAGS: None   WEIGHT BEARING RESTRICTIONS: No  FALLS:  Has patient fallen in last 6 months?  No  LIVING ENVIRONMENT: Lives with: lives with their family Lives in: House/apartment  OCCUPATION: Gas station attendant  PLOF: Independent  PATIENT GOALS: decrease knee pain, be able to go up/down stairs and ladders for work  NEXT MD VISIT: PRN  OBJECTIVE:  Note: Objective measures were completed at Evaluation unless otherwise noted.  DIAGNOSTIC FINDINGS: see imaging   PATIENT SURVEYS:  LEFS  Extreme difficulty/unable (0), Quite a bit of difficulty (1), Moderate difficulty (2), Little difficulty (3), No difficulty (4) Survey date:  05/09/2024  Any of your usual work, housework or school activities 2  2. Usual hobbies, recreational or sporting activities 1  3. Getting into/out of the bath 3  4. Walking between rooms 3  5. Putting on socks/shoes 2  6. Squatting  1  7. Lifting an object, like a bag of groceries from the floor 3  8. Performing light activities around your home 2  9. Performing heavy activities around your home 2  10. Getting into/out of a car 0  11. Walking 2 blocks 0  12. Walking 1 mile 0  13. Going up/down 10 stairs (1 flight) 1  14. Standing for 1 hour 2  15.  sitting for 1 hour 4  16. Running on even ground 2  17. Running on uneven ground 1  18. Making sharp turns while running fast 1  19. Hopping  0  20. Rolling over in bed 2  Score total:  35/80     COGNITION: Overall cognitive status: Within functional limits for tasks assessed     SENSATION: WFL  POSTURE: rounded shoulders and weight shift left  PALPATION: TTP to distal R quad  LOWER EXTREMITY ROM:  Active ROM Right eval Right 05/23/24 Right 05/28/24 Right 06/04/24  Hip flexion      Hip extension      Hip abduction      Hip adduction      Hip internal rotation      Hip external rotation      Knee flexion 95 100 90 111 AAROM  Knee extension 6  4   Ankle dorsiflexion      Ankle plantarflexion      Ankle inversion      Ankle eversion       (Blank rows = not tested)  LOWER  EXTREMITY MMT:  MMT Right eval Left eval  Hip flexion 3 4  Hip extension    Hip abduction    Hip adduction    Hip internal rotation    Hip external rotation    Knee flexion 2+ 5  Knee extension 2+ 5  Ankle dorsiflexion    Ankle plantarflexion    Ankle inversion    Ankle eversion     (Blank rows = not tested)  LOWER EXTREMITY SPECIAL TESTS:  Knee special tests: Anterior drawer test: negative, Posterior drawer test: negative, and Lachman Test: negative  FUNCTIONAL TESTS:  30 Second Sit to Stand: 4 reps with UE  GAIT: Distance walked: 26ft Assistive device utilized: None Level of assistance: Complete Independence Comments: antalgic gait R  TREATMENT: [facility] Adult PT Treatment:                                                DATE: 06/04/2024 Supine QS x 10 - 5 hold Supine SLR 2x10 R Hooklying clamshell 2x15 GTB SAQ 2x15 R - 5 hold Supine heel slide 2x10 - 5 hold R with strap Supine hamstring stretch 2x30 R LAQ 3x5 R 2# STS 2x5 R foot back high table  [facility] Adult PT Treatment:                                                DATE: 05/28/2024 Seated hamstring stretch 2x30 R Supine QS x 10 - 5 hold Supine SLR 2x10 R Hooklying clamshell 2x15 GTB S/L hip abd 2x5 R Supine heel slide 2x10 - 5 hold R with strap Supine hamstring stretch 2x30 R Seated heel slide 2x10 R - 5 hold LAQ 2x5 R  [facility] Adult PT Treatment:                                                DATE: 05/23/2024 Seated hamstring stretch 2x30 R Supine QS x 10 - 5 hold Supine SLR 2x5 R Hooklying clamshell 2x15 GTB S/L hip abd 2x5 R Supine heel slide 2x10 - 5 hold R Supine hamstring stretch 2x30 R LAQ 3x5 R  [facility] Adult PT Treatment:  DATE: 05/14/2024 Supine QS 2x10 - 5 hold McConnell tape R  Supine SLR - increased pain  Seated hamstring stretch 2x30 R LAQ 3x5 R S/L hip abd 2x5 R Modalities:  GameReady: Low compression 36 degrees 10 min  Right  knee  [facility] Adult PT Treatment:                                                DATE: 05/09/2024 Therapeutic Exercise: Supine heel slide x 5 - 5 hold R Supine QS x 5 - 5 hold Seated clamshell x 10 GTB  PATIENT EDUCATION:  Education details: eval findings, LEFS, HEP, POC Person educated: Patient Education method: Explanation, Demonstration, and Handouts Education comprehension: verbalized understanding and returned demonstration  HOME EXERCISE PROGRAM: Access Code: [email or web address] URL: [email or web address] Date: 05/28/2024 Prepared by: Alm Kingdom  Exercises - Supine Heel Slide with Strap  - 2 x daily - 7 x weekly - 2 sets - 10 reps - 5 sec hold - Supine Quad Set (Mirrored)  - 2 x daily - 7 x weekly - 2 sets - 10 reps - 5 sec hold - Seated Hip Abduction with Resistance  - 2 x daily - 7 x weekly - 3 sets - 10 reps - green band hold - Seated Hamstring Stretch  - 1 x daily - 7 x weekly - 2 sets - 30 sec hold - Seated Long Arc Quad  - 1 x daily - 7 x weekly - 3 sets - 5 reps  ASSESSMENT:  CLINICAL IMPRESSION: Pt tolerated treatment well today and was able to complete prescribed exercises despite continued discomfort. Improved R knee flexion AAROM today. Exercises continued to focus on knee ROM and quad control. Continues to require skilled PT services, will continue per POC.    EVAL: Patient is a 48 y.o. F who was seen today for physical therapy evaluation and treatment for acute R knee pain. Physical findings are consistent with MD impression as pt demonstrates significant decrease in R knee strength and ROM as well as decrease in functional mobility. LEFS score demonstrates severe disability in performance of home ADLs and higher level activities. She would benefit from skilled PT services working to improve quad and hip strength and overall mobility and ROM in order to improve function and decrease pain.    OBJECTIVE IMPAIRMENTS: decreased activity tolerance, decreased balance,  decreased mobility, difficulty walking, decreased ROM, decreased strength, and pain  ACTIVITY LIMITATIONS: carrying, lifting, standing, squatting, stairs, transfers, and locomotion level  PARTICIPATION LIMITATIONS: driving, shopping, community activity, occupation, and yard work  PERSONAL FACTORS: Time since onset of injury/illness/exacerbation are also affecting patient's functional outcome.   REHAB POTENTIAL: Good  CLINICAL DECISION MAKING: Stable/uncomplicated  EVALUATION COMPLEXITY: Low   GOALS: Goals reviewed with patient? No  SHORT TERM GOALS: Target date: 05/30/2024   Pt will be compliant and knowledgeable with initial HEP for improved comfort and carryover Baseline: initial HEP given  Goal status: INITIAL  2.  Pt will self report right knee pain no greater than 6/10 for improved comfort and functional ability Baseline: 10/10 at worst Goal status: INITIAL   LONG TERM GOALS: Target date: 07/05/2024   Pt will improve LEFS to no less than 46/80 as proxy for functional improvement with home ADLs and higher level community activity Baseline: 35/80 Goal status: INITIAL  2.  Pt will self report right knee  pain no greater than 3/10 for improved comfort and functional ability Baseline: 8/10 at worst Goal status: INITIAL   3.  Pt will increase 30 Second Sit to Stand rep count to no less than 8 reps for improved balance, strength, and functional mobility Baseline: 4 reps with UE Goal status: INITIAL   4.  Pt will improve R knee ROM to range of 0-110 degrees for improved functional mobility and decreased pain Baseline: see chart Goal status: INITIAL  5.  Pt will improve R LE MMT to at least 4/5 for all tested motions for improved comfort and mobility Baseline: see chart Goal status: INITIAL    PLAN:  PT FREQUENCY: 2x/week  PT DURATION: 8 weeks  PLANNED INTERVENTIONS: 97164- PT Re-evaluation, 97110-Therapeutic exercises, 97530- Therapeutic activity, [id number]-  Neuromuscular re-education, 97535- Self Care, 02859- Manual therapy, [id number]- Gait training, [phone]- Electrical stimulation (unattended), [id number]- Electrical stimulation (manual), 97016- Vasopneumatic device, 20560 (1-2 muscles), 20561 (3+ muscles)- Dry Needling, Cryotherapy, and Moist heat  PLAN FOR NEXT SESSION: assess HEP response, quad and hip strengthening    Alm JAYSON Kingdom, PT 06/04/2024, 10:56 AM

## 2024-06-06 ENCOUNTER — Encounter: Payer: Self-pay | Admitting: Physical Therapy

## 2024-06-06 ENCOUNTER — Ambulatory Visit: Attending: Orthopedic Surgery | Admitting: Physical Therapy

## 2024-06-06 DIAGNOSIS — M6281 Muscle weakness (generalized): Secondary | ICD-10-CM | POA: Insufficient documentation

## 2024-06-06 DIAGNOSIS — M25561 Pain in right knee: Secondary | ICD-10-CM | POA: Insufficient documentation

## 2024-06-06 DIAGNOSIS — R2689 Other abnormalities of gait and mobility: Secondary | ICD-10-CM | POA: Diagnosis present

## 2024-06-06 NOTE — Therapy (Signed)
 OUTPATIENT PHYSICAL THERAPY TREATMENT   Patient Name: Katie Mcdonald MRN: [id number] DOB:10-15-1975, 48 y.o., female Today's Date: 06/06/2024  END OF SESSION:  PT End of Session - 06/06/24 1058     Visit Number 6    Number of Visits 17    Date for Recertification  07/05/24    Authorization Type [employer]    PT Start Time 1100    PT Stop Time 1145    PT Time Calculation (min) 45 min              History reviewed. No pertinent past medical history. Past Surgical History:  Procedure Laterality Date   CYST REMOVAL LEG     There are no active problems to display for this patient.   PCP: No PCP  REFERRING PROVIDER: Josefina Chew, MD   REFERRING DIAG: [phone] (ICD-10-CM) - Right knee pain   THERAPY DIAG:  Acute pain of right knee  Muscle weakness (generalized)  Other abnormalities of gait and mobility  Rationale for Evaluation and Treatment: Rehabilitation  ONSET DATE: 04/11/2024  SUBJECTIVE:   SUBJECTIVE STATEMENT: Pt reports that knee has been aggravated since the last visit. Has been taking motrin  and using heat, ice and Tens. 4/10 pain on arrival with antalgic gait pattern.   EVAL: Pt presents to PT with reports of 4 week hx of R knee pain with no trauma or MOI. Thinks it may be related to being on her feet a lot at work but is unsure. Has had a decent amount of swelling off and on over last few weeks. Stairs and squatting have been very painful. Was going to have MRI potentially if symptoms continue.   PERTINENT HISTORY: See PMH  PAIN:  Are you having pain?  Yes: NPRS scale: 4/10 Worst: 8/10 Pain location: right knee, right thigh Pain description: sharp, tight Aggravating factors: stairs, standing, squatting Relieving factors: rest, medication  PRECAUTIONS: None  RED FLAGS: None   WEIGHT BEARING RESTRICTIONS: No  FALLS:  Has patient fallen in last 6 months? No  LIVING ENVIRONMENT: Lives with: lives with their family Lives in:  House/apartment  OCCUPATION: Gas station attendant  PLOF: Independent  PATIENT GOALS: decrease knee pain, be able to go up/down stairs and ladders for work  NEXT MD VISIT: PRN  OBJECTIVE:  Note: Objective measures were completed at Evaluation unless otherwise noted.  DIAGNOSTIC FINDINGS: see imaging   PATIENT SURVEYS:  LEFS  Extreme difficulty/unable (0), Quite a bit of difficulty (1), Moderate difficulty (2), Little difficulty (3), No difficulty (4) Survey date:  05/09/2024  Any of your usual work, housework or school activities 2  2. Usual hobbies, recreational or sporting activities 1  3. Getting into/out of the bath 3  4. Walking between rooms 3  5. Putting on socks/shoes 2  6. Squatting  1  7. Lifting an object, like a bag of groceries from the floor 3  8. Performing light activities around your home 2  9. Performing heavy activities around your home 2  10. Getting into/out of a car 0  11. Walking 2 blocks 0  12. Walking 1 mile 0  13. Going up/down 10 stairs (1 flight) 1  14. Standing for 1 hour 2  15.  sitting for 1 hour 4  16. Running on even ground 2  17. Running on uneven ground 1  18. Making sharp turns while running fast 1  19. Hopping  0  20. Rolling over in bed 2  Score total:  35/80  COGNITION: Overall cognitive status: Within functional limits for tasks assessed     SENSATION: WFL  POSTURE: rounded shoulders and weight shift left  PALPATION: TTP to distal R quad  LOWER EXTREMITY ROM:  Active ROM Right eval Right 05/23/24 Right 05/28/24 Right 06/04/24  Hip flexion      Hip extension      Hip abduction      Hip adduction      Hip internal rotation      Hip external rotation      Knee flexion 95 100 90 111 AAROM  Knee extension 6  4   Ankle dorsiflexion      Ankle plantarflexion      Ankle inversion      Ankle eversion       (Blank rows = not tested)  LOWER EXTREMITY MMT:  MMT Right eval Left eval  Hip flexion 3 4  Hip  extension    Hip abduction    Hip adduction    Hip internal rotation    Hip external rotation    Knee flexion 2+ 5  Knee extension 2+ 5  Ankle dorsiflexion    Ankle plantarflexion    Ankle inversion    Ankle eversion     (Blank rows = not tested)  LOWER EXTREMITY SPECIAL TESTS:  Knee special tests: Anterior drawer test: negative, Posterior drawer test: negative, and Lachman Test: negative  FUNCTIONAL TESTS:  30 Second Sit to Stand: 4 reps with UE  GAIT: Distance walked: 39ft Assistive device utilized: None Level of assistance: Complete Independence Comments: antalgic gait R  TREATMENT: [facility] Adult PT Treatment:                                                DATE: 06/06/24 Therapeutic Activity Seated heel slide x 10 Seated Hamstring stretch 10 se x 3 - pain QS 5 sec x 10 into towel  Supine SLR 10 x 2  Supine heel slide on slide board Passive h.s stretch with ER/IR Standing gastroc stretch at wall 10 sec x 3 Rec Bike full revolutions x 3 minutes  Manual Therapy: STW right posterior knee / hamstrings KT tape to posterior knee  to I strips medial and lateral calf to hamstring     [facility] Adult PT Treatment:                                                DATE: 06/04/2024 Supine QS x 10 - 5 hold Supine SLR 2x10 R Hooklying clamshell 2x15 GTB SAQ 2x15 R - 5 hold Supine heel slide 2x10 - 5 hold R with strap Supine hamstring stretch 2x30 R LAQ 3x5 R 2# STS 2x5 R foot back high table  [facility] Adult PT Treatment:                                                DATE: 05/28/2024 Seated hamstring stretch 2x30 R Supine QS x 10 - 5 hold Supine SLR 2x10 R Hooklying clamshell 2x15 GTB S/L hip abd 2x5 R Supine heel slide 2x10 - 5 hold R with  strap Supine hamstring stretch 2x30 R Seated heel slide 2x10 R - 5 hold LAQ 2x5 R  [facility] Adult PT Treatment:                                                DATE: 05/23/2024 Seated hamstring stretch 2x30 R Supine QS x 10 - 5  hold Supine SLR 2x5 R Hooklying clamshell 2x15 GTB S/L hip abd 2x5 R Supine heel slide 2x10 - 5 hold R Supine hamstring stretch 2x30 R LAQ 3x5 R  [facility] Adult PT Treatment:                                                DATE: 05/14/2024 Supine QS 2x10 - 5 hold McConnell tape R  Supine SLR - increased pain  Seated hamstring stretch 2x30 R LAQ 3x5 R S/L hip abd 2x5 R Modalities:  GameReady: Low compression 36 degrees 10 min  Right knee  [facility] Adult PT Treatment:                                                DATE: 05/09/2024 Therapeutic Exercise: Supine heel slide x 5 - 5 hold R Supine QS x 5 - 5 hold Seated clamshell x 10 GTB  PATIENT EDUCATION:  Education details: eval findings, LEFS, HEP, POC Person educated: Patient Education method: Explanation, Demonstration, and Handouts Education comprehension: verbalized understanding and returned demonstration  HOME EXERCISE PROGRAM: Access Code: [email or web address] URL: [email or web address] Date: 05/28/2024 Prepared by: Alm Kingdom  Exercises - Supine Heel Slide with Strap  - 2 x daily - 7 x weekly - 2 sets - 10 reps - 5 sec hold - Supine Quad Set (Mirrored)  - 2 x daily - 7 x weekly - 2 sets - 10 reps - 5 sec hold - Seated Hip Abduction with Resistance  - 2 x daily - 7 x weekly - 3 sets - 10 reps - green band hold - Seated Hamstring Stretch  - 1 x daily - 7 x weekly - 2 sets - 30 sec hold - Seated Long Arc Quad  - 1 x daily - 7 x weekly - 3 sets - 5 reps  ASSESSMENT:  CLINICAL IMPRESSION: Pt tolerated treatment well today and was able to complete prescribed exercises despite continued discomfort. Able to tolerate full revolutions on Rec bike. STW performed to posterior knee/hamstring. KT tape also applied to reduce posterior knee pain. Exercises continued to focus on knee ROM and quad control. Continues to require skilled PT services, will continue per POC.    EVAL: Patient is a 48 y.o. F who was seen today for  physical therapy evaluation and treatment for acute R knee pain. Physical findings are consistent with MD impression as pt demonstrates significant decrease in R knee strength and ROM as well as decrease in functional mobility. LEFS score demonstrates severe disability in performance of home ADLs and higher level activities. She would benefit from skilled PT services working to improve quad and hip strength and overall mobility and ROM in order to improve function and decrease pain.  OBJECTIVE IMPAIRMENTS: decreased activity tolerance, decreased balance, decreased mobility, difficulty walking, decreased ROM, decreased strength, and pain  ACTIVITY LIMITATIONS: carrying, lifting, standing, squatting, stairs, transfers, and locomotion level  PARTICIPATION LIMITATIONS: driving, shopping, community activity, occupation, and yard work  PERSONAL FACTORS: Time since onset of injury/illness/exacerbation are also affecting patient's functional outcome.   REHAB POTENTIAL: Good  CLINICAL DECISION MAKING: Stable/uncomplicated  EVALUATION COMPLEXITY: Low   GOALS: Goals reviewed with patient? No  SHORT TERM GOALS: Target date: 05/30/2024   Pt will be compliant and knowledgeable with initial HEP for improved comfort and carryover Baseline: initial HEP given  Goal status: INITIAL  2.  Pt will self report right knee pain no greater than 6/10 for improved comfort and functional ability Baseline: 10/10 at worst Goal status: INITIAL   LONG TERM GOALS: Target date: 07/05/2024   Pt will improve LEFS to no less than 46/80 as proxy for functional improvement with home ADLs and higher level community activity Baseline: 35/80 Goal status: INITIAL  2.  Pt will self report right knee pain no greater than 3/10 for improved comfort and functional ability Baseline: 8/10 at worst Goal status: INITIAL   3.  Pt will increase 30 Second Sit to Stand rep count to no less than 8 reps for improved balance,  strength, and functional mobility Baseline: 4 reps with UE Goal status: INITIAL   4.  Pt will improve R knee ROM to range of 0-110 degrees for improved functional mobility and decreased pain Baseline: see chart Goal status: INITIAL  5.  Pt will improve R LE MMT to at least 4/5 for all tested motions for improved comfort and mobility Baseline: see chart Goal status: INITIAL    PLAN:  PT FREQUENCY: 2x/week  PT DURATION: 8 weeks  PLANNED INTERVENTIONS: 97164- PT Re-evaluation, 97110-Therapeutic exercises, 97530- Therapeutic activity, 97112- Neuromuscular re-education, 97535- Self Care, 02859- Manual therapy, [id number]- Gait training, [phone]- Electrical stimulation (unattended), [id number]- Electrical stimulation (manual), 97016- Vasopneumatic device, 20560 (1-2 muscles), 20561 (3+ muscles)- Dry Needling, Cryotherapy, and Moist heat  PLAN FOR NEXT SESSION: assess HEP response, quad and hip strengthening    Harlene Persons, PTA 06/06/24 12:47 PM Phone: [phone] Fax: [phone]

## 2024-06-11 ENCOUNTER — Ambulatory Visit

## 2024-06-11 DIAGNOSIS — M25561 Pain in right knee: Secondary | ICD-10-CM

## 2024-06-11 DIAGNOSIS — R2689 Other abnormalities of gait and mobility: Secondary | ICD-10-CM

## 2024-06-11 DIAGNOSIS — M6281 Muscle weakness (generalized): Secondary | ICD-10-CM

## 2024-06-11 NOTE — Therapy (Signed)
 OUTPATIENT PHYSICAL THERAPY TREATMENT   Patient Name: Katie Mcdonald MRN: [id number] DOB:09-15-1975, 48 y.o., female Today's Date: 06/11/2024  END OF SESSION:  PT End of Session - 06/11/24 0925     Visit Number 7    Number of Visits 17    Date for Recertification  07/05/24    Authorization Type [employer]    PT Start Time 0930    PT Stop Time 1010    PT Time Calculation (min) 40 min               History reviewed. No pertinent past medical history. Past Surgical History:  Procedure Laterality Date   CYST REMOVAL LEG     There are no active problems to display for this patient.   PCP: No PCP  REFERRING PROVIDER: Josefina Chew, MD   REFERRING DIAG: [phone] (ICD-10-CM) - Right knee pain   THERAPY DIAG:  Acute pain of right knee  Muscle weakness (generalized)  Other abnormalities of gait and mobility  Rationale for Evaluation and Treatment: Rehabilitation  ONSET DATE: 04/11/2024  SUBJECTIVE:   SUBJECTIVE STATEMENT: Pt presents to PT no current pain, notes a dull ache but overall feeling better. Has been compliant with HEP no adverse effect.   EVAL: Pt presents to PT with reports of 4 week hx of R knee pain with no trauma or MOI. Thinks it may be related to being on her feet a lot at work but is unsure. Has had a decent amount of swelling off and on over last few weeks. Stairs and squatting have been very painful. Was going to have MRI potentially if symptoms continue.   PERTINENT HISTORY: See PMH  PAIN:  Are you having pain?  Yes: NPRS scale: 4/10 Worst: 8/10 Pain location: right knee, right thigh Pain description: sharp, tight Aggravating factors: stairs, standing, squatting Relieving factors: rest, medication  PRECAUTIONS: None  RED FLAGS: None   WEIGHT BEARING RESTRICTIONS: No  FALLS:  Has patient fallen in last 6 months? No  LIVING ENVIRONMENT: Lives with: lives with their family Lives in: House/apartment  OCCUPATION: Gas station  attendant  PLOF: Independent  PATIENT GOALS: decrease knee pain, be able to go up/down stairs and ladders for work  NEXT MD VISIT: PRN  OBJECTIVE:  Note: Objective measures were completed at Evaluation unless otherwise noted.  DIAGNOSTIC FINDINGS: see imaging   PATIENT SURVEYS:  LEFS  Extreme difficulty/unable (0), Quite a bit of difficulty (1), Moderate difficulty (2), Little difficulty (3), No difficulty (4) Survey date:  05/09/2024  Any of your usual work, housework or school activities 2  2. Usual hobbies, recreational or sporting activities 1  3. Getting into/out of the bath 3  4. Walking between rooms 3  5. Putting on socks/shoes 2  6. Squatting  1  7. Lifting an object, like a bag of groceries from the floor 3  8. Performing light activities around your home 2  9. Performing heavy activities around your home 2  10. Getting into/out of a car 0  11. Walking 2 blocks 0  12. Walking 1 mile 0  13. Going up/down 10 stairs (1 flight) 1  14. Standing for 1 hour 2  15.  sitting for 1 hour 4  16. Running on even ground 2  17. Running on uneven ground 1  18. Making sharp turns while running fast 1  19. Hopping  0  20. Rolling over in bed 2  Score total:  35/80     COGNITION:  Overall cognitive status: Within functional limits for tasks assessed     SENSATION: WFL  POSTURE: rounded shoulders and weight shift left  PALPATION: TTP to distal R quad  LOWER EXTREMITY ROM:  Active ROM Right eval Right 05/23/24 Right 05/28/24 Right 06/04/24 Right 06/11/2024  Hip flexion       Hip extension       Hip abduction       Hip adduction       Hip internal rotation       Hip external rotation       Knee flexion 95 100 90 111 AAROM   Knee extension 6  4    Ankle dorsiflexion       Ankle plantarflexion       Ankle inversion       Ankle eversion        (Blank rows = not tested)  LOWER EXTREMITY MMT:  MMT Right eval Left eval  Hip flexion 3 4  Hip extension    Hip  abduction    Hip adduction    Hip internal rotation    Hip external rotation    Knee flexion 2+ 5  Knee extension 2+ 5  Ankle dorsiflexion    Ankle plantarflexion    Ankle inversion    Ankle eversion     (Blank rows = not tested)  LOWER EXTREMITY SPECIAL TESTS:  Knee special tests: Anterior drawer test: negative, Posterior drawer test: negative, and Lachman Test: negative  FUNCTIONAL TESTS:  30 Second Sit to Stand: 4 reps with UE  GAIT: Distance walked: 21ft Assistive device utilized: None Level of assistance: Complete Independence Comments: antalgic gait R  TREATMENT: [facility] Adult PT Treatment:                                                DATE: 06/11/24 Therapeutic Activity Supine QS x 10 - 5 hold R Supine SLR 3x10 R Supine heel slide with strap x 10 - 5 hold Supine hamstring stretch with strap 2x30 R LAQ 3x10 2.5# R STS 2x10 - more WB on R Step ups x 10 fwd 1 UE 6in R Step flexion stretch x 10 - 5 hold R TKE with ball x 10 - 5 hold R Manual Therapy: STW and TPR to R posterior knee/hamstrings and calf  [facility] Adult PT Treatment:                                                DATE: 06/06/24 Therapeutic Activity Seated heel slide x 10 Seated Hamstring stretch 10 se x 3 - pain QS 5 sec x 10 into towel  Supine SLR 10 x 2  Supine heel slide on slide board Passive h.s stretch with ER/IR Standing gastroc stretch at wall 10 sec x 3 Rec Bike full revolutions x 3 minutes  Manual Therapy: STW right posterior knee / hamstrings KT tape to posterior knee  to I strips medial and lateral calf to hamstring  [facility] Adult PT Treatment:  DATE: 06/04/2024 Supine QS x 10 - 5 hold Supine SLR 2x10 R Hooklying clamshell 2x15 GTB SAQ 2x15 R - 5 hold Supine heel slide 2x10 - 5 hold R with strap Supine hamstring stretch 2x30 R LAQ 3x5 R 2# STS 2x5 R foot back high table  PATIENT EDUCATION:  Education details: eval findings, LEFS,  HEP, POC Person educated: Patient Education method: Explanation, Demonstration, and Handouts Education comprehension: verbalized understanding and returned demonstration  HOME EXERCISE PROGRAM: Access Code: [email or web address] URL: [email or web address] Date: 05/28/2024 Prepared by: Alm Kingdom  Exercises - Supine Heel Slide with Strap  - 2 x daily - 7 x weekly - 2 sets - 10 reps - 5 sec hold - Supine Quad Set (Mirrored)  - 2 x daily - 7 x weekly - 2 sets - 10 reps - 5 sec hold - Seated Hip Abduction with Resistance  - 2 x daily - 7 x weekly - 3 sets - 10 reps - green band hold - Seated Hamstring Stretch  - 1 x daily - 7 x weekly - 2 sets - 30 sec hold - Seated Long Arc Quad  - 1 x daily - 7 x weekly - 3 sets - 5 reps  ASSESSMENT:  CLINICAL IMPRESSION: Pt was able to complete prescribed exercises with no adverse effect. Exercises continued to focus on functional mobility and activity tolerance with R quad and R LE strength. Responded well to manual therapy interventions, noting decreased pain and soreness post session. ROM also shows continued improvement in R knee. Continues to benefit from skilled PT services, will continue per POC.   EVAL: Patient is a 48 y.o. F who was seen today for physical therapy evaluation and treatment for acute R knee pain. Physical findings are consistent with MD impression as pt demonstrates significant decrease in R knee strength and ROM as well as decrease in functional mobility. LEFS score demonstrates severe disability in performance of home ADLs and higher level activities. She would benefit from skilled PT services working to improve quad and hip strength and overall mobility and ROM in order to improve function and decrease pain.    OBJECTIVE IMPAIRMENTS: decreased activity tolerance, decreased balance, decreased mobility, difficulty walking, decreased ROM, decreased strength, and pain  ACTIVITY LIMITATIONS: carrying, lifting, standing, squatting,  stairs, transfers, and locomotion level  PARTICIPATION LIMITATIONS: driving, shopping, community activity, occupation, and yard work  PERSONAL FACTORS: Time since onset of injury/illness/exacerbation are also affecting patient's functional outcome.   REHAB POTENTIAL: Good  CLINICAL DECISION MAKING: Stable/uncomplicated  EVALUATION COMPLEXITY: Low   GOALS: Goals reviewed with patient? No  SHORT TERM GOALS: Target date: 05/30/2024   Pt will be compliant and knowledgeable with initial HEP for improved comfort and carryover Baseline: initial HEP given  Goal status: INITIAL  2.  Pt will self report right knee pain no greater than 6/10 for improved comfort and functional ability Baseline: 10/10 at worst Goal status: INITIAL   LONG TERM GOALS: Target date: 07/05/2024   Pt will improve LEFS to no less than 46/80 as proxy for functional improvement with home ADLs and higher level community activity Baseline: 35/80 Goal status: INITIAL  2.  Pt will self report right knee pain no greater than 3/10 for improved comfort and functional ability Baseline: 8/10 at worst Goal status: INITIAL   3.  Pt will increase 30 Second Sit to Stand rep count to no less than 8 reps for improved balance, strength, and functional mobility Baseline: 4 reps with UE Goal status: INITIAL  4.  Pt will improve R knee ROM to range of 0-110 degrees for improved functional mobility and decreased pain Baseline: see chart Goal status: INITIAL  5.  Pt will improve R LE MMT to at least 4/5 for all tested motions for improved comfort and mobility Baseline: see chart Goal status: INITIAL    PLAN:  PT FREQUENCY: 2x/week  PT DURATION: 8 weeks  PLANNED INTERVENTIONS: 97164- PT Re-evaluation, 97110-Therapeutic exercises, 97530- Therapeutic activity, [id number]- Neuromuscular re-education, 97535- Self Care, 02859- Manual therapy, [id number]- Gait training, [phone]- Electrical stimulation (unattended), [id number]- Electrical  stimulation (manual), 97016- Vasopneumatic device, 20560 (1-2 muscles), 20561 (3+ muscles)- Dry Needling, Cryotherapy, and Moist heat  PLAN FOR NEXT SESSION: assess HEP response, quad and hip strengthening    Alm JAYSON Kingdom PT  06/11/24 10:27 AM

## 2024-06-13 ENCOUNTER — Ambulatory Visit

## 2024-06-14 ENCOUNTER — Encounter: Payer: Self-pay | Admitting: Physical Medicine & Rehabilitation

## 2024-06-17 ENCOUNTER — Ambulatory Visit: Payer: Self-pay

## 2024-06-17 DIAGNOSIS — R2689 Other abnormalities of gait and mobility: Secondary | ICD-10-CM

## 2024-06-17 DIAGNOSIS — M25561 Pain in right knee: Secondary | ICD-10-CM

## 2024-06-17 DIAGNOSIS — M6281 Muscle weakness (generalized): Secondary | ICD-10-CM

## 2024-06-17 NOTE — Therapy (Signed)
 OUTPATIENT PHYSICAL THERAPY TREATMENT   Patient Name: Katie Mcdonald MRN: [id number] DOB:26-Aug-1976, 48 y.o., female Today's Date: 06/17/2024  END OF SESSION:  PT End of Session - 06/17/24 1019     Visit Number 8    Number of Visits 17    Date for Recertification  07/05/24    Authorization Type [employer]    PT Start Time 1016    PT Stop Time 1056    PT Time Calculation (min) 40 min                No past medical history on file. Past Surgical History:  Procedure Laterality Date   CYST REMOVAL LEG     There are no active problems to display for this patient.   PCP: No PCP  REFERRING PROVIDER: Josefina Chew, MD   REFERRING DIAG: [phone] (ICD-10-CM) - Right knee pain   THERAPY DIAG:  Acute pain of right knee  Muscle weakness (generalized)  Other abnormalities of gait and mobility  Rationale for Evaluation and Treatment: Rehabilitation  ONSET DATE: 04/11/2024  SUBJECTIVE:   SUBJECTIVE STATEMENT: Pt presents to PT with reports of continued improvement in R knee. 1/10 pain today, notes it is still tight.   EVAL: Pt presents to PT with reports of 4 week hx of R knee pain with no trauma or MOI. Thinks it may be related to being on her feet a lot at work but is unsure. Has had a decent amount of swelling off and on over last few weeks. Stairs and squatting have been very painful. Was going to have MRI potentially if symptoms continue.   PERTINENT HISTORY: See PMH  PAIN:  Are you having pain?  Yes: NPRS scale: 4/10 Worst: 8/10 Pain location: right knee, right thigh Pain description: sharp, tight Aggravating factors: stairs, standing, squatting Relieving factors: rest, medication  PRECAUTIONS: None  RED FLAGS: None   WEIGHT BEARING RESTRICTIONS: No  FALLS:  Has patient fallen in last 6 months? No  LIVING ENVIRONMENT: Lives with: lives with their family Lives in: House/apartment  OCCUPATION: Gas station attendant  PLOF: Independent  PATIENT  GOALS: decrease knee pain, be able to go up/down stairs and ladders for work  NEXT MD VISIT: PRN  OBJECTIVE:  Note: Objective measures were completed at Evaluation unless otherwise noted.  DIAGNOSTIC FINDINGS: see imaging   PATIENT SURVEYS:  LEFS  Extreme difficulty/unable (0), Quite a bit of difficulty (1), Moderate difficulty (2), Little difficulty (3), No difficulty (4) Survey date:  05/09/2024  Any of your usual work, housework or school activities 2  2. Usual hobbies, recreational or sporting activities 1  3. Getting into/out of the bath 3  4. Walking between rooms 3  5. Putting on socks/shoes 2  6. Squatting  1  7. Lifting an object, like a bag of groceries from the floor 3  8. Performing light activities around your home 2  9. Performing heavy activities around your home 2  10. Getting into/out of a car 0  11. Walking 2 blocks 0  12. Walking 1 mile 0  13. Going up/down 10 stairs (1 flight) 1  14. Standing for 1 hour 2  15.  sitting for 1 hour 4  16. Running on even ground 2  17. Running on uneven ground 1  18. Making sharp turns while running fast 1  19. Hopping  0  20. Rolling over in bed 2  Score total:  35/80     COGNITION: Overall cognitive status:  Within functional limits for tasks assessed     SENSATION: WFL  POSTURE: rounded shoulders and weight shift left  PALPATION: TTP to distal R quad  LOWER EXTREMITY ROM:  Active ROM Right eval Right 05/23/24 Right 05/28/24 Right 06/04/24 Right 06/11/2024  Hip flexion       Hip extension       Hip abduction       Hip adduction       Hip internal rotation       Hip external rotation       Knee flexion 95 100 90 111 AAROM   Knee extension 6  4    Ankle dorsiflexion       Ankle plantarflexion       Ankle inversion       Ankle eversion        (Blank rows = not tested)  LOWER EXTREMITY MMT:  MMT Right eval Left eval  Hip flexion 3 4  Hip extension    Hip abduction    Hip adduction    Hip internal  rotation    Hip external rotation    Knee flexion 2+ 5  Knee extension 2+ 5  Ankle dorsiflexion    Ankle plantarflexion    Ankle inversion    Ankle eversion     (Blank rows = not tested)  LOWER EXTREMITY SPECIAL TESTS:  Knee special tests: Anterior drawer test: negative, Posterior drawer test: negative, and Lachman Test: negative  FUNCTIONAL TESTS:  30 Second Sit to Stand: 4 reps with UE  GAIT: Distance walked: 72ft Assistive device utilized: None Level of assistance: Complete Independence Comments: antalgic gait R  TREATMENT: [facility] Adult PT Treatment:                                                DATE: 06/17/24 Therapeutic Activity Supine QS x 10 - 5 hold R Supine SLR 3x10 R S/L hip abd 2x10 R LAQ 3x10 4# R STS 3x10 - more WB on R TKE with ball x 10 - 5 hold R Step ups x 10 fwd 1 UE 8in R Standing hip abd 2x10 ea Manual Therapy: STW and TPR to R posterior knee/hamstrings and calf  [facility] Adult PT Treatment:                                                DATE: 06/11/24 Therapeutic Activity Supine QS x 10 - 5 hold R Supine SLR 3x10 R Supine heel slide with strap x 10 - 5 hold Supine hamstring stretch with strap 2x30 R LAQ 3x10 2.5# R STS 2x10 - more WB on R Step ups x 10 fwd 1 UE 6in R Step flexion stretch x 10 - 5 hold R TKE with ball x 10 - 5 hold R Manual Therapy: STW and TPR to R posterior knee/hamstrings and calf  [facility] Adult PT Treatment:                                                DATE: 06/06/24 Therapeutic Activity Seated heel slide x 10 Seated Hamstring stretch 10  se x 3 - pain QS 5 sec x 10 into towel  Supine SLR 10 x 2  Supine heel slide on slide board Passive h.s stretch with ER/IR Standing gastroc stretch at wall 10 sec x 3 Rec Bike full revolutions x 3 minutes  Manual Therapy: STW right posterior knee / hamstrings KT tape to posterior knee  to I strips medial and lateral calf to hamstring  [facility] Adult PT Treatment:                                                 DATE: 06/04/2024 Supine QS x 10 - 5 hold Supine SLR 2x10 R Hooklying clamshell 2x15 GTB SAQ 2x15 R - 5 hold Supine heel slide 2x10 - 5 hold R with strap Supine hamstring stretch 2x30 R LAQ 3x5 R 2# STS 2x5 R foot back high table  PATIENT EDUCATION:  Education details: eval findings, LEFS, HEP, POC Person educated: Patient Education method: Explanation, Demonstration, and Handouts Education comprehension: verbalized understanding and returned demonstration  HOME EXERCISE PROGRAM: Access Code: [email or web address] URL: [email or web address] Date: 05/28/2024 Prepared by: Alm Kingdom  Exercises - Supine Heel Slide with Strap  - 2 x daily - 7 x weekly - 2 sets - 10 reps - 5 sec hold - Supine Quad Set (Mirrored)  - 2 x daily - 7 x weekly - 2 sets - 10 reps - 5 sec hold - Seated Hip Abduction with Resistance  - 2 x daily - 7 x weekly - 3 sets - 10 reps - green band hold - Seated Hamstring Stretch  - 1 x daily - 7 x weekly - 2 sets - 30 sec hold - Seated Long Arc Quad  - 1 x daily - 7 x weekly - 3 sets - 5 reps  ASSESSMENT:  CLINICAL IMPRESSION: Pt was able to complete prescribed exercises with no adverse effect. Exercises continued to focus on functional mobility and activity tolerance with R quad and R LE strength. Responded well to manual therapy interventions once again, noting decreased pain and soreness post session. Continues to benefit from skilled PT services, will continue per POC.   EVAL: Patient is a 48 y.o. F who was seen today for physical therapy evaluation and treatment for acute R knee pain. Physical findings are consistent with MD impression as pt demonstrates significant decrease in R knee strength and ROM as well as decrease in functional mobility. LEFS score demonstrates severe disability in performance of home ADLs and higher level activities. She would benefit from skilled PT services working to improve quad and hip strength and overall  mobility and ROM in order to improve function and decrease pain.    OBJECTIVE IMPAIRMENTS: decreased activity tolerance, decreased balance, decreased mobility, difficulty walking, decreased ROM, decreased strength, and pain  ACTIVITY LIMITATIONS: carrying, lifting, standing, squatting, stairs, transfers, and locomotion level  PARTICIPATION LIMITATIONS: driving, shopping, community activity, occupation, and yard work  PERSONAL FACTORS: Time since onset of injury/illness/exacerbation are also affecting patient's functional outcome.   REHAB POTENTIAL: Good  CLINICAL DECISION MAKING: Stable/uncomplicated  EVALUATION COMPLEXITY: Low   GOALS: Goals reviewed with patient? No  SHORT TERM GOALS: Target date: 05/30/2024   Pt will be compliant and knowledgeable with initial HEP for improved comfort and carryover Baseline: initial HEP given  Goal status: INITIAL  2.  Pt will self report right knee pain  no greater than 6/10 for improved comfort and functional ability Baseline: 10/10 at worst Goal status: INITIAL   LONG TERM GOALS: Target date: 07/05/2024   Pt will improve LEFS to no less than 46/80 as proxy for functional improvement with home ADLs and higher level community activity Baseline: 35/80 Goal status: INITIAL  2.  Pt will self report right knee pain no greater than 3/10 for improved comfort and functional ability Baseline: 8/10 at worst Goal status: INITIAL   3.  Pt will increase 30 Second Sit to Stand rep count to no less than 8 reps for improved balance, strength, and functional mobility Baseline: 4 reps with UE Goal status: INITIAL   4.  Pt will improve R knee ROM to range of 0-110 degrees for improved functional mobility and decreased pain Baseline: see chart Goal status: INITIAL  5.  Pt will improve R LE MMT to at least 4/5 for all tested motions for improved comfort and mobility Baseline: see chart Goal status: INITIAL    PLAN:  PT FREQUENCY: 2x/week  PT  DURATION: 8 weeks  PLANNED INTERVENTIONS: 97164- PT Re-evaluation, 97110-Therapeutic exercises, 97530- Therapeutic activity, [id number]- Neuromuscular re-education, 97535- Self Care, 02859- Manual therapy, [id number]- Gait training, [phone]- Electrical stimulation (unattended), [id number]- Electrical stimulation (manual), 97016- Vasopneumatic device, 20560 (1-2 muscles), 20561 (3+ muscles)- Dry Needling, Cryotherapy, and Moist heat  PLAN FOR NEXT SESSION: assess HEP response, quad and hip strengthening    Alm JAYSON Kingdom PT  06/17/24 11:54 AM

## 2024-06-25 ENCOUNTER — Ambulatory Visit: Admitting: Physical Therapy

## 2024-06-25 ENCOUNTER — Encounter: Payer: Self-pay | Admitting: Physical Therapy

## 2024-06-25 DIAGNOSIS — M25561 Pain in right knee: Secondary | ICD-10-CM | POA: Diagnosis not present

## 2024-06-25 DIAGNOSIS — R2689 Other abnormalities of gait and mobility: Secondary | ICD-10-CM

## 2024-06-25 DIAGNOSIS — M6281 Muscle weakness (generalized): Secondary | ICD-10-CM

## 2024-06-25 NOTE — Therapy (Signed)
 OUTPATIENT PHYSICAL THERAPY TREATMENT   Patient Name: Katie Mcdonald MRN: [id number] DOB:[birth year], 48 y.o., female Today's Date: 06/25/2024  END OF SESSION:  PT End of Session - 06/25/24 0937     Visit Number 9    Number of Visits 17    Date for Recertification  07/05/24    Authorization Type [employer]    PT Start Time 0935    PT Stop Time 1013    PT Time Calculation (min) 38 min                History reviewed. No pertinent past medical history. Past Surgical History:  Procedure Laterality Date   CYST REMOVAL LEG     There are no active problems to display for this patient.   PCP: No PCP  REFERRING PROVIDER: Josefina Chew, MD   REFERRING DIAG: [phone] (ICD-10-CM) - Right knee pain   THERAPY DIAG:  Acute pain of right knee  Muscle weakness (generalized)  Other abnormalities of gait and mobility  Rationale for Evaluation and Treatment: Rehabilitation  ONSET DATE: 04/11/2024  SUBJECTIVE:   SUBJECTIVE STATEMENT: Pt presents to PT with reports of continued improvement in R knee. . 3/10 pain today due to crawling on knees yesterday on road  while changing battery to car  EVAL: Pt presents to PT with reports of 4 week hx of R knee pain with no trauma or MOI. Thinks it may be related to being on her feet a lot at work but is unsure. Has had a decent amount of swelling off and on over last few weeks. Stairs and squatting have been very painful. Was going to have MRI potentially if symptoms continue.   PERTINENT HISTORY: See PMH  PAIN:  Are you having pain?  Yes: NPRS scale: 3/10 Worst: 8/10 Pain location: right knee, right thigh Pain description: sharp, tight Aggravating factors: stairs, standing, squatting Relieving factors: rest, medication  PRECAUTIONS: None  RED FLAGS: None   WEIGHT BEARING RESTRICTIONS: No  FALLS:  Has patient fallen in last 6 months? No  LIVING ENVIRONMENT: Lives with: lives with their family Lives in:  House/apartment  OCCUPATION: Gas station attendant  PLOF: Independent  PATIENT GOALS: decrease knee pain, be able to go up/down stairs and ladders for work  NEXT MD VISIT: PRN  OBJECTIVE:  Note: Objective measures were completed at Evaluation unless otherwise noted.  DIAGNOSTIC FINDINGS: see imaging   PATIENT SURVEYS:  LEFS  Extreme difficulty/unable (0), Quite a bit of difficulty (1), Moderate difficulty (2), Little difficulty (3), No difficulty (4) Survey date:  05/09/2024  Any of your usual work, housework or school activities 2  2. Usual hobbies, recreational or sporting activities 1  3. Getting into/out of the bath 3  4. Walking between rooms 3  5. Putting on socks/shoes 2  6. Squatting  1  7. Lifting an object, like a bag of groceries from the floor 3  8. Performing light activities around your home 2  9. Performing heavy activities around your home 2  10. Getting into/out of a car 0  11. Walking 2 blocks 0  12. Walking 1 mile 0  13. Going up/down 10 stairs (1 flight) 1  14. Standing for 1 hour 2  15.  sitting for 1 hour 4  16. Running on even ground 2  17. Running on uneven ground 1  18. Making sharp turns while running fast 1  19. Hopping  0  20. Rolling over in bed 2  Score total:  35/80     COGNITION: Overall cognitive status: Within functional limits for tasks assessed     SENSATION: WFL  POSTURE: rounded shoulders and weight shift left  PALPATION: TTP to distal R quad  LOWER EXTREMITY ROM:  Active ROM Right eval Right 05/23/24 Right 05/28/24 Right 06/04/24 Right 06/11/2024  Hip flexion       Hip extension       Hip abduction       Hip adduction       Hip internal rotation       Hip external rotation       Knee flexion 95 100 90 111 AAROM   Knee extension 6  4    Ankle dorsiflexion       Ankle plantarflexion       Ankle inversion       Ankle eversion        (Blank rows = not tested)  LOWER EXTREMITY MMT:  MMT Right eval Left eval  Right 06/25/24  Hip flexion 3 4   Hip extension     Hip abduction     Hip adduction     Hip internal rotation     Hip external rotation     Knee flexion 2+ 5 4+  Knee extension 2+ 5 4+  Ankle dorsiflexion     Ankle plantarflexion     Ankle inversion     Ankle eversion      (Blank rows = not tested)  LOWER EXTREMITY SPECIAL TESTS:  Knee special tests: Anterior drawer test: negative, Posterior drawer test: negative, and Lachman Test: negative  FUNCTIONAL TESTS:  30 Second Sit to Stand: 4 reps with UE  GAIT: Distance walked: 31ft Assistive device utilized: None Level of assistance: Complete Independence Comments: antalgic gait R  TREATMENT: [facility] Adult PT Treatment:                                                DATE: 06/25/24 Therapeutic Activity Supine QS x 10 - 5 hold R Supine SLR 3x10 R S/L hip abd 3x10 R LAQ 2x10 5# R STS 2x10 - 10# KB Step ups x 10 fwd 1 UE 8in R 4 inch lateral step down x 10 Standing hip abd 2x10 ea Heel raises x 10 with increased weight on right Gastroc stretch on slant board     [facility] Adult PT Treatment:                                                DATE: 06/17/24 Therapeutic Activity Supine QS x 10 - 5 hold R Supine SLR 3x10 R S/L hip abd 2x10 R LAQ 3x10 4# R STS 3x10 - more WB on R TKE with ball x 10 - 5 hold R Step ups x 10 fwd 1 UE 8in R Standing hip abd 2x10 ea Manual Therapy: STW and TPR to R posterior knee/hamstrings and calf  [facility] Adult PT Treatment:                                                DATE: 06/11/24 Therapeutic Activity  Supine QS x 10 - 5 hold R Supine SLR 3x10 R Supine heel slide with strap x 10 - 5 hold Supine hamstring stretch with strap 2x30 R LAQ 3x10 2.5# R STS 2x10 - more WB on R Step ups x 10 fwd 1 UE 6in R Step flexion stretch x 10 - 5 hold R TKE with ball x 10 - 5 hold R Manual Therapy: STW and TPR to R posterior knee/hamstrings and calf  [facility] Adult PT Treatment:                                                 DATE: 06/06/24 Therapeutic Activity Seated heel slide x 10 Seated Hamstring stretch 10 se x 3 - pain QS 5 sec x 10 into towel  Supine SLR 10 x 2  Supine heel slide on slide board Passive h.s stretch with ER/IR Standing gastroc stretch at wall 10 sec x 3 Rec Bike full revolutions x 3 minutes  Manual Therapy: STW right posterior knee / hamstrings KT tape to posterior knee  to I strips medial and lateral calf to hamstring  [facility] Adult PT Treatment:                                                DATE: 06/04/2024 Supine QS x 10 - 5 hold Supine SLR 2x10 R Hooklying clamshell 2x15 GTB SAQ 2x15 R - 5 hold Supine heel slide 2x10 - 5 hold R with strap Supine hamstring stretch 2x30 R LAQ 3x5 R 2# STS 2x5 R foot back high table  PATIENT EDUCATION:  Education details: eval findings, LEFS, HEP, POC Person educated: Patient Education method: Explanation, Demonstration, and Handouts Education comprehension: verbalized understanding and returned demonstration  HOME EXERCISE PROGRAM: Access Code: [email or web address] URL: [email or web address] Date: 05/28/2024 Prepared by: Alm Kingdom  Exercises - Supine Heel Slide with Strap  - 2 x daily - 7 x weekly - 2 sets - 10 reps - 5 sec hold - Supine Quad Set (Mirrored)  - 2 x daily - 7 x weekly - 2 sets - 10 reps - 5 sec hold - Seated Hip Abduction with Resistance  - 2 x daily - 7 x weekly - 3 sets - 10 reps - green band hold - Seated Hamstring Stretch  - 1 x daily - 7 x weekly - 2 sets - 30 sec hold - Seated Long Arc Quad  - 1 x daily - 7 x weekly - 3 sets - 5 reps  ASSESSMENT:  CLINICAL IMPRESSION: Pt was able to complete prescribed exercises with no adverse effect. Exercises continued to focus on functional mobility and activity tolerance with R quad and R LE strength. Declined manual therapy today. MMT improved.Some increased pain with step down.  Continues to benefit from skilled PT services, will continue per POC.    EVAL: Patient is a 48 y.o. F who was seen today for physical therapy evaluation and treatment for acute R knee pain. Physical findings are consistent with MD impression as pt demonstrates significant decrease in R knee strength and ROM as well as decrease in functional mobility. LEFS score demonstrates severe disability in performance of home ADLs and higher level activities. She would benefit from skilled PT services  working to improve quad and hip strength and overall mobility and ROM in order to improve function and decrease pain.    OBJECTIVE IMPAIRMENTS: decreased activity tolerance, decreased balance, decreased mobility, difficulty walking, decreased ROM, decreased strength, and pain  ACTIVITY LIMITATIONS: carrying, lifting, standing, squatting, stairs, transfers, and locomotion level  PARTICIPATION LIMITATIONS: driving, shopping, community activity, occupation, and yard work  PERSONAL FACTORS: Time since onset of injury/illness/exacerbation are also affecting patient's functional outcome.   REHAB POTENTIAL: Good  CLINICAL DECISION MAKING: Stable/uncomplicated  EVALUATION COMPLEXITY: Low   GOALS: Goals reviewed with patient? No  SHORT TERM GOALS: Target date: 05/30/2024   Pt will be compliant and knowledgeable with initial HEP for improved comfort and carryover Baseline: initial HEP given  Goal status: MET   2.  Pt will self report right knee pain no greater than 6/10 for improved comfort and functional ability Baseline: 10/10 at worst Goal status:  MET  LONG TERM GOALS: Target date: 07/05/2024   Pt will improve LEFS to no less than 46/80 as proxy for functional improvement with home ADLs and higher level community activity Baseline: 35/80 Goal status: INITIAL  2.  Pt will self report right knee pain no greater than 3/10 for improved comfort and functional ability Baseline: 8/10 at worst Goal status: INITIAL   3.  Pt will increase 30 Second Sit to Stand rep count to  no less than 8 reps for improved balance, strength, and functional mobility Baseline: 4 reps with UE Goal status: INITIAL   4.  Pt will improve R knee ROM to range of 0-110 degrees for improved functional mobility and decreased pain Baseline: see chart Goal status: INITIAL  5.  Pt will improve R LE MMT to at least 4/5 for all tested motions for improved comfort and mobility Baseline: see chart Goal status: INITIAL    PLAN:  PT FREQUENCY: 2x/week  PT DURATION: 8 weeks  PLANNED INTERVENTIONS: 97164- PT Re-evaluation, 97110-Therapeutic exercises, 97530- Therapeutic activity, 97112- Neuromuscular re-education, 97535- Self Care, 02859- Manual therapy, [id number]- Gait training, [phone]- Electrical stimulation (unattended), [id number]- Electrical stimulation (manual), 97016- Vasopneumatic device, 20560 (1-2 muscles), 20561 (3+ muscles)- Dry Needling, Cryotherapy, and Moist heat  PLAN FOR NEXT SESSION: assess HEP response, quad and hip strengthening   Harlene Persons, PTA 06/25/24 12:05 PM Phone: [phone] Fax: [phone]

## 2024-06-27 ENCOUNTER — Ambulatory Visit

## 2024-06-27 DIAGNOSIS — M25561 Pain in right knee: Secondary | ICD-10-CM | POA: Diagnosis not present

## 2024-06-27 DIAGNOSIS — R2689 Other abnormalities of gait and mobility: Secondary | ICD-10-CM

## 2024-06-27 DIAGNOSIS — M6281 Muscle weakness (generalized): Secondary | ICD-10-CM

## 2024-06-27 NOTE — Therapy (Signed)
 OUTPATIENT PHYSICAL THERAPY TREATMENT   Patient Name: Katie Mcdonald MRN: [id number] DOB:12-24-1975, 48 y.o., female Today's Date: 06/27/2024  END OF SESSION:  PT End of Session - 06/27/24 1434     Visit Number 10    Number of Visits 17    Date for Recertification  07/05/24    Authorization Type M[employer]    PT Start Time 1445    PT Stop Time 1525    PT Time Calculation (min) 40 min    Activity Tolerance Patient tolerated treatment well    Behavior During Therapy [facility] for tasks assessed/performed                History reviewed. No pertinent past medical history. Past Surgical History:  Procedure Laterality Date   CYST REMOVAL LEG     There are no active problems to display for this patient.   PCP: No PCP  REFERRING PROVIDER: Josefina Chew, MD   REFERRING DIAG: [phone] (ICD-10-CM) - Right knee pain   THERAPY DIAG:  Acute pain of right knee  Muscle weakness (generalized)  Other abnormalities of gait and mobility  Rationale for Evaluation and Treatment: Rehabilitation  ONSET DATE: 04/11/2024  SUBJECTIVE:   SUBJECTIVE STATEMENT: Pt presents to PT with reports of 5/10 R knee pain moving from sit>standing yesterday and feeling it pop. Has continued HEP compliance. Has f/u visit with MD on 07/08/2024.   EVAL: Pt presents to PT with reports of 4 week hx of R knee pain with no trauma or MOI. Thinks it may be related to being on her feet a lot at work but is unsure. Has had a decent amount of swelling off and on over last few weeks. Stairs and squatting have been very painful. Was going to have MRI potentially if symptoms continue.   PERTINENT HISTORY: See PMH  PAIN:  Are you having pain?  Yes: NPRS scale: 3/10 Worst: 8/10 Pain location: right knee, right thigh Pain description: sharp, tight Aggravating factors: stairs, standing, squatting Relieving factors: rest, medication  PRECAUTIONS: None  RED FLAGS: None   WEIGHT BEARING RESTRICTIONS:  No  FALLS:  Has patient fallen in last 6 months? No  LIVING ENVIRONMENT: Lives with: lives with their family Lives in: House/apartment  OCCUPATION: Gas station attendant  PLOF: Independent  PATIENT GOALS: decrease knee pain, be able to go up/down stairs and ladders for work  NEXT MD VISIT: PRN  OBJECTIVE:  Note: Objective measures were completed at Evaluation unless otherwise noted.  DIAGNOSTIC FINDINGS: see imaging   PATIENT SURVEYS:  LEFS  Extreme difficulty/unable (0), Quite a bit of difficulty (1), Moderate difficulty (2), Little difficulty (3), No difficulty (4) Survey date:  05/09/2024  Any of your usual work, housework or school activities 2  2. Usual hobbies, recreational or sporting activities 1  3. Getting into/out of the bath 3  4. Walking between rooms 3  5. Putting on socks/shoes 2  6. Squatting  1  7. Lifting an object, like a bag of groceries from the floor 3  8. Performing light activities around your home 2  9. Performing heavy activities around your home 2  10. Getting into/out of a car 0  11. Walking 2 blocks 0  12. Walking 1 mile 0  13. Going up/down 10 stairs (1 flight) 1  14. Standing for 1 hour 2  15.  sitting for 1 hour 4  16. Running on even ground 2  17. Running on uneven ground 1  18. Making sharp  turns while running fast 1  19. Hopping  0  20. Rolling over in bed 2  Score total:  35/80     COGNITION: Overall cognitive status: Within functional limits for tasks assessed     SENSATION: WFL  POSTURE: rounded shoulders and weight shift left  PALPATION: TTP to distal R quad  LOWER EXTREMITY ROM:  Active ROM Right eval Right 05/23/24 Right 05/28/24 Right 06/04/24 Right 06/11/2024  Hip flexion       Hip extension       Hip abduction       Hip adduction       Hip internal rotation       Hip external rotation       Knee flexion 95 100 90 111 AAROM   Knee extension 6  4    Ankle dorsiflexion       Ankle plantarflexion        Ankle inversion       Ankle eversion        (Blank rows = not tested)  LOWER EXTREMITY MMT:  MMT Right eval Left eval Right 06/25/24  Hip flexion 3 4   Hip extension     Hip abduction     Hip adduction     Hip internal rotation     Hip external rotation     Knee flexion 2+ 5 4+  Knee extension 2+ 5 4+  Ankle dorsiflexion     Ankle plantarflexion     Ankle inversion     Ankle eversion      (Blank rows = not tested)  LOWER EXTREMITY SPECIAL TESTS:  Knee special tests: Anterior drawer test: negative, Posterior drawer test: negative, and Lachman Test: negative  FUNCTIONAL TESTS:  30 Second Sit to Stand: 4 reps with UE  GAIT: Distance walked: 65ft Assistive device utilized: None Level of assistance: Complete Independence Comments: antalgic gait R  TREATMENT: [facility] Adult PT Treatment:                                                DATE: 06/27/24 Therapeutic Activity Supine QS x 10 - 5 hold R Supine SLR 3x10 R S/L hip abd 2x15 R LAQ 3x10 5# R Standing hip abd 2x10 25# STS 2x10 - 10# KB Slant board calf stretch 2x30  Step up fwd 2x10 R 8in 1 UE Step up lat 2x10 R 8in B UE  [facility] Adult PT Treatment:                                                DATE: 06/25/24 Therapeutic Activity Supine QS x 10 - 5 hold R Supine SLR 3x10 R S/L hip abd 3x10 R LAQ 2x10 5# R STS 2x10 - 10# KB Step ups x 10 fwd 1 UE 8in R 4 inch lateral step down x 10 Standing hip abd 2x10 ea Heel raises x 10 with increased weight on right Gastroc stretch on slant board   [facility] Adult PT Treatment:  DATE: 06/17/24 Therapeutic Activity Supine QS x 10 - 5 hold R Supine SLR 3x10 R S/L hip abd 2x10 R LAQ 3x10 4# R STS 3x10 - more WB on R TKE with ball x 10 - 5 hold R Step ups x 10 fwd 1 UE 8in R Standing hip abd 2x10 ea Manual Therapy: STW and TPR to R posterior knee/hamstrings and calf  [facility] Adult PT Treatment:                                                 DATE: 06/11/24 Therapeutic Activity Supine QS x 10 - 5 hold R Supine SLR 3x10 R Supine heel slide with strap x 10 - 5 hold Supine hamstring stretch with strap 2x30 R LAQ 3x10 2.5# R STS 2x10 - more WB on R Step ups x 10 fwd 1 UE 6in R Step flexion stretch x 10 - 5 hold R TKE with ball x 10 - 5 hold R Manual Therapy: STW and TPR to R posterior knee/hamstrings and calf  [facility] Adult PT Treatment:                                                DATE: 06/06/24 Therapeutic Activity Seated heel slide x 10 Seated Hamstring stretch 10 se x 3 - pain QS 5 sec x 10 into towel  Supine SLR 10 x 2  Supine heel slide on slide board Passive h.s stretch with ER/IR Standing gastroc stretch at wall 10 sec x 3 Rec Bike full revolutions x 3 minutes  Manual Therapy: STW right posterior knee / hamstrings KT tape to posterior knee  to I strips medial and lateral calf to hamstring  [facility] Adult PT Treatment:                                                DATE: 06/04/2024 Supine QS x 10 - 5 hold Supine SLR 2x10 R Hooklying clamshell 2x15 GTB SAQ 2x15 R - 5 hold Supine heel slide 2x10 - 5 hold R with strap Supine hamstring stretch 2x30 R LAQ 3x5 R 2# STS 2x5 R foot back high table  PATIENT EDUCATION:  Education details: eval findings, LEFS, HEP, POC Person educated: Patient Education method: Explanation, Demonstration, and Handouts Education comprehension: verbalized understanding and returned demonstration  HOME EXERCISE PROGRAM: Access Code: [email or web address] URL: [email or web address] Date: 05/28/2024 Prepared by: Alm Kingdom  Exercises - Supine Heel Slide with Strap  - 2 x daily - 7 x weekly - 2 sets - 10 reps - 5 sec hold - Supine Quad Set (Mirrored)  - 2 x daily - 7 x weekly - 2 sets - 10 reps - 5 sec hold - Seated Hip Abduction with Resistance  - 2 x daily - 7 x weekly - 3 sets - 10 reps - green band hold - Seated Hamstring Stretch  - 1 x daily - 7 x weekly - 2  sets - 30 sec hold - Seated Long Arc Quad  - 1 x daily - 7 x weekly - 3 sets - 5 reps  ASSESSMENT:  CLINICAL  IMPRESSION: Pt was able to complete prescribed exercises with no adverse effect. Exercises continued to focus on functional mobility and activity tolerance with R quad and R LE strength. Continues to benefit from skilled PT services, will continue per POC.   EVAL: Patient is a 48 y.o. F who was seen today for physical therapy evaluation and treatment for acute R knee pain. Physical findings are consistent with MD impression as pt demonstrates significant decrease in R knee strength and ROM as well as decrease in functional mobility. LEFS score demonstrates severe disability in performance of home ADLs and higher level activities. She would benefit from skilled PT services working to improve quad and hip strength and overall mobility and ROM in order to improve function and decrease pain.    OBJECTIVE IMPAIRMENTS: decreased activity tolerance, decreased balance, decreased mobility, difficulty walking, decreased ROM, decreased strength, and pain  ACTIVITY LIMITATIONS: carrying, lifting, standing, squatting, stairs, transfers, and locomotion level  PARTICIPATION LIMITATIONS: driving, shopping, community activity, occupation, and yard work  PERSONAL FACTORS: Time since onset of injury/illness/exacerbation are also affecting patient's functional outcome.   REHAB POTENTIAL: Good  CLINICAL DECISION MAKING: Stable/uncomplicated  EVALUATION COMPLEXITY: Low   GOALS: Goals reviewed with patient? No  SHORT TERM GOALS: Target date: 05/30/2024   Pt will be compliant and knowledgeable with initial HEP for improved comfort and carryover Baseline: initial HEP given  Goal status: MET   2.  Pt will self report right knee pain no greater than 6/10 for improved comfort and functional ability Baseline: 10/10 at worst Goal status:  MET  LONG TERM GOALS: Target date: 07/05/2024   Pt will  improve LEFS to no less than 46/80 as proxy for functional improvement with home ADLs and higher level community activity Baseline: 35/80 Goal status: INITIAL  2.  Pt will self report right knee pain no greater than 3/10 for improved comfort and functional ability Baseline: 8/10 at worst Goal status: INITIAL   3.  Pt will increase 30 Second Sit to Stand rep count to no less than 8 reps for improved balance, strength, and functional mobility Baseline: 4 reps with UE Goal status: INITIAL   4.  Pt will improve R knee ROM to range of 0-110 degrees for improved functional mobility and decreased pain Baseline: see chart Goal status: INITIAL  5.  Pt will improve R LE MMT to at least 4/5 for all tested motions for improved comfort and mobility Baseline: see chart Goal status: INITIAL    PLAN:  PT FREQUENCY: 2x/week  PT DURATION: 8 weeks  PLANNED INTERVENTIONS: 97164- PT Re-evaluation, 97110-Therapeutic exercises, 97530- Therapeutic activity, [id number]- Neuromuscular re-education, 97535- Self Care, [id number]- Manual therapy, [id number]- Gait training, [phone]- Electrical stimulation (unattended), [id number]- Electrical stimulation (manual), 97016- Vasopneumatic device, 20560 (1-2 muscles), 20561 (3+ muscles)- Dry Needling, Cryotherapy, and Moist heat  PLAN FOR NEXT SESSION: assess HEP response, quad and hip strengthening   Alm JAYSON Kingdom PT  06/27/24 3:26 PM

## 2024-07-01 ENCOUNTER — Ambulatory Visit

## 2024-07-01 DIAGNOSIS — R2689 Other abnormalities of gait and mobility: Secondary | ICD-10-CM

## 2024-07-01 DIAGNOSIS — M25561 Pain in right knee: Secondary | ICD-10-CM | POA: Diagnosis not present

## 2024-07-01 DIAGNOSIS — M6281 Muscle weakness (generalized): Secondary | ICD-10-CM

## 2024-07-01 NOTE — Therapy (Signed)
 OUTPATIENT PHYSICAL THERAPY TREATMENT   Patient Name: Katie Mcdonald MRN: [id number] DOB:15-Mar-1976, 48 y.o., female Today's Date: 07/01/2024  END OF SESSION:  PT End of Session - 07/01/24 0841     Visit Number 11    Number of Visits 17    Date for Recertification  07/05/24    Authorization Type MCD [facility]    PT Start Time 0843    PT Stop Time 0921    PT Time Calculation (min) 38 min    Activity Tolerance Patient tolerated treatment well    Behavior During Therapy [facility] for tasks assessed/performed                 No past medical history on file. Past Surgical History:  Procedure Laterality Date   CYST REMOVAL LEG     There are no active problems to display for this patient.   PCP: No PCP  REFERRING PROVIDER: Josefina Chew, MD   REFERRING DIAG: [phone] (ICD-10-CM) - Right knee pain   THERAPY DIAG:  Acute pain of right knee  Muscle weakness (generalized)  Other abnormalities of gait and mobility  Rationale for Evaluation and Treatment: Rehabilitation  ONSET DATE: 04/11/2024  SUBJECTIVE:   SUBJECTIVE STATEMENT: Pt presents to PT with no current knee pain. Pt continues HEP compliance.   EVAL: Pt presents to PT with reports of 4 week hx of R knee pain with no trauma or MOI. Thinks it may be related to being on her feet a lot at work but is unsure. Has had a decent amount of swelling off and on over last few weeks. Stairs and squatting have been very painful. Was going to have MRI potentially if symptoms continue.   PERTINENT HISTORY: See PMH  PAIN:  Are you having pain?  Yes: NPRS scale: 0/10 Worst: 8/10 Pain location: right knee, right thigh Pain description: sharp, tight Aggravating factors: stairs, standing, squatting Relieving factors: rest, medication  PRECAUTIONS: None  RED FLAGS: None   WEIGHT BEARING RESTRICTIONS: No  FALLS:  Has patient fallen in last 6 months? No  LIVING ENVIRONMENT: Lives with: lives with their family Lives in:  House/apartment  OCCUPATION: Gas station attendant  PLOF: Independent  PATIENT GOALS: decrease knee pain, be able to go up/down stairs and ladders for work  NEXT MD VISIT: PRN  OBJECTIVE:  Note: Objective measures were completed at Evaluation unless otherwise noted.  DIAGNOSTIC FINDINGS: see imaging   PATIENT SURVEYS:  LEFS  Extreme difficulty/unable (0), Quite a bit of difficulty (1), Moderate difficulty (2), Little difficulty (3), No difficulty (4) Survey date:  05/09/2024  Any of your usual work, housework or school activities 2  2. Usual hobbies, recreational or sporting activities 1  3. Getting into/out of the bath 3  4. Walking between rooms 3  5. Putting on socks/shoes 2  6. Squatting  1  7. Lifting an object, like a bag of groceries from the floor 3  8. Performing light activities around your home 2  9. Performing heavy activities around your home 2  10. Getting into/out of a car 0  11. Walking 2 blocks 0  12. Walking 1 mile 0  13. Going up/down 10 stairs (1 flight) 1  14. Standing for 1 hour 2  15.  sitting for 1 hour 4  16. Running on even ground 2  17. Running on uneven ground 1  18. Making sharp turns while running fast 1  19. Hopping  0  20. Rolling over in bed 2  Score total:  35/80     COGNITION: Overall cognitive status: Within functional limits for tasks assessed     SENSATION: WFL  POSTURE: rounded shoulders and weight shift left  PALPATION: TTP to distal R quad  LOWER EXTREMITY ROM:  Active ROM Right eval Right 05/23/24 Right 05/28/24 Right 06/04/24 Right 06/11/2024  Hip flexion       Hip extension       Hip abduction       Hip adduction       Hip internal rotation       Hip external rotation       Knee flexion 95 100 90 111 AAROM   Knee extension 6  4    Ankle dorsiflexion       Ankle plantarflexion       Ankle inversion       Ankle eversion        (Blank rows = not tested)  LOWER EXTREMITY MMT:  MMT Right eval Left eval  Right 06/25/24  Hip flexion 3 4   Hip extension     Hip abduction     Hip adduction     Hip internal rotation     Hip external rotation     Knee flexion 2+ 5 4+  Knee extension 2+ 5 4+  Ankle dorsiflexion     Ankle plantarflexion     Ankle inversion     Ankle eversion      (Blank rows = not tested)  LOWER EXTREMITY SPECIAL TESTS:  Knee special tests: Anterior drawer test: negative, Posterior drawer test: negative, and Lachman Test: negative  FUNCTIONAL TESTS:  30 Second Sit to Stand: 4 reps with UE  GAIT: Distance walked: 22ft Assistive device utilized: None Level of assistance: Complete Independence Comments: antalgic gait R  TREATMENT: [facility] Adult PT Treatment:                                                DATE: 07/01/24 Supine QS x 10 - 5 hold R Supine SLR 3x10 R 2# S/L hip abd 2x10 R 2# LAQ 3x10 5# R Standing hip abd/ext 2x10 25# Step up fwd 2x10 R 8in no UE Eccentric heel tap - 2in drop R stance B UE Slant board calf stretch 2x45  STS 2x10 - 10# KB R foot back Seated hamstring curl 2x15 GTB Bridge 3x10 S/L clamshell 2x10 GTB R  [facility] Adult PT Treatment:                                                DATE: 06/27/24 Therapeutic Activity Supine QS x 10 - 5 hold R Supine SLR 3x10 R S/L hip abd 2x15 R LAQ 3x10 5# R Standing hip abd 2x10 25# STS 2x10 - 10# KB Slant board calf stretch 2x30  Step up fwd 2x10 R 8in 1 UE Step up lat 2x10 R 8in B UE  [facility] Adult PT Treatment:                                                DATE: 06/25/24 Therapeutic Activity  Supine QS x 10 - 5 hold R Supine SLR 3x10 R S/L hip abd 3x10 R LAQ 2x10 5# R STS 2x10 - 10# KB Step ups x 10 fwd 1 UE 8in R 4 inch lateral step down x 10 Standing hip abd 2x10 ea Heel raises x 10 with increased weight on right Gastroc stretch on slant board   [facility] Adult PT Treatment:                                                DATE: 06/17/24 Therapeutic Activity Supine QS x 10 - 5 hold  R Supine SLR 3x10 R S/L hip abd 2x10 R LAQ 3x10 4# R STS 3x10 - more WB on R TKE with ball x 10 - 5 hold R Step ups x 10 fwd 1 UE 8in R Standing hip abd 2x10 ea Manual Therapy: STW and TPR to R posterior knee/hamstrings and calf  [facility] Adult PT Treatment:                                                DATE: 06/11/24 Therapeutic Activity Supine QS x 10 - 5 hold R Supine SLR 3x10 R Supine heel slide with strap x 10 - 5 hold Supine hamstring stretch with strap 2x30 R LAQ 3x10 2.5# R STS 2x10 - more WB on R Step ups x 10 fwd 1 UE 6in R Step flexion stretch x 10 - 5 hold R TKE with ball x 10 - 5 hold R Manual Therapy: STW and TPR to R posterior knee/hamstrings and calf  [facility] Adult PT Treatment:                                                DATE: 06/06/24 Therapeutic Activity Seated heel slide x 10 Seated Hamstring stretch 10 se x 3 - pain QS 5 sec x 10 into towel  Supine SLR 10 x 2  Supine heel slide on slide board Passive h.s stretch with ER/IR Standing gastroc stretch at wall 10 sec x 3 Rec Bike full revolutions x 3 minutes  Manual Therapy: STW right posterior knee / hamstrings KT tape to posterior knee  to I strips medial and lateral calf to hamstring  [facility] Adult PT Treatment:                                                DATE: 06/04/2024 Supine QS x 10 - 5 hold Supine SLR 2x10 R Hooklying clamshell 2x15 GTB SAQ 2x15 R - 5 hold Supine heel slide 2x10 - 5 hold R with strap Supine hamstring stretch 2x30 R LAQ 3x5 R 2# STS 2x5 R foot back high table  PATIENT EDUCATION:  Education details: eval findings, LEFS, HEP, POC Person educated: Patient Education method: Explanation, Demonstration, and Handouts Education comprehension: verbalized understanding and returned demonstration  HOME EXERCISE PROGRAM: Access Code: [email or web address] URL: [email or web address] Date: 05/28/2024 Prepared by: Alm Kingdom  Exercises - Supine Heel Slide with  Strap  - 2 x  daily - 7 x weekly - 2 sets - 10 reps - 5 sec hold - Supine Quad Set (Mirrored)  - 2 x daily - 7 x weekly - 2 sets - 10 reps - 5 sec hold - Seated Hip Abduction with Resistance  - 2 x daily - 7 x weekly - 3 sets - 10 reps - green band hold - Seated Hamstring Stretch  - 1 x daily - 7 x weekly - 2 sets - 30 sec hold - Seated Long Arc Quad  - 1 x daily - 7 x weekly - 3 sets - 5 reps  ASSESSMENT:  CLINICAL IMPRESSION: Pt was able to complete prescribed exercises with no adverse effect. Exercises continued to focus on functional mobility and activity tolerance with R quad and R LE strength. Continues to benefit from skilled PT services, will continue per POC and assess goals next session.   EVAL: Patient is a 48 y.o. F who was seen today for physical therapy evaluation and treatment for acute R knee pain. Physical findings are consistent with MD impression as pt demonstrates significant decrease in R knee strength and ROM as well as decrease in functional mobility. LEFS score demonstrates severe disability in performance of home ADLs and higher level activities. She would benefit from skilled PT services working to improve quad and hip strength and overall mobility and ROM in order to improve function and decrease pain.    OBJECTIVE IMPAIRMENTS: decreased activity tolerance, decreased balance, decreased mobility, difficulty walking, decreased ROM, decreased strength, and pain  ACTIVITY LIMITATIONS: carrying, lifting, standing, squatting, stairs, transfers, and locomotion level  PARTICIPATION LIMITATIONS: driving, shopping, community activity, occupation, and yard work  PERSONAL FACTORS: Time since onset of injury/illness/exacerbation are also affecting patient's functional outcome.   REHAB POTENTIAL: Good  CLINICAL DECISION MAKING: Stable/uncomplicated  EVALUATION COMPLEXITY: Low   GOALS: Goals reviewed with patient? No  SHORT TERM GOALS: Target date: 05/30/2024   Pt will be compliant and  knowledgeable with initial HEP for improved comfort and carryover Baseline: initial HEP given  Goal status: MET   2.  Pt will self report right knee pain no greater than 6/10 for improved comfort and functional ability Baseline: 10/10 at worst Goal status:  MET  LONG TERM GOALS: Target date: 07/05/2024   Pt will improve LEFS to no less than 46/80 as proxy for functional improvement with home ADLs and higher level community activity Baseline: 35/80 Goal status: INITIAL  2.  Pt will self report right knee pain no greater than 3/10 for improved comfort and functional ability Baseline: 8/10 at worst Goal status: INITIAL   3.  Pt will increase 30 Second Sit to Stand rep count to no less than 8 reps for improved balance, strength, and functional mobility Baseline: 4 reps with UE Goal status: INITIAL   4.  Pt will improve R knee ROM to range of 0-110 degrees for improved functional mobility and decreased pain Baseline: see chart Goal status: INITIAL  5.  Pt will improve R LE MMT to at least 4/5 for all tested motions for improved comfort and mobility Baseline: see chart Goal status: INITIAL    PLAN:  PT FREQUENCY: 2x/week  PT DURATION: 8 weeks  PLANNED INTERVENTIONS: 97164- PT Re-evaluation, 97110-Therapeutic exercises, 97530- Therapeutic activity, [id number]- Neuromuscular re-education, 97535- Self Care, 02859- Manual therapy, [id number]- Gait training, [id number]- Electrical stimulation (unattended), [id number]- Electrical stimulation (manual), 97016- Vasopneumatic device, 20560 (1-2 muscles), 20561 (3+ muscles)- Dry Needling, Cryotherapy, and  Moist heat  PLAN FOR NEXT SESSION: assess HEP response, quad and hip strengthening   Alm JAYSON Kingdom PT  07/01/24 9:25 AM

## 2024-07-04 ENCOUNTER — Ambulatory Visit

## 2024-07-04 DIAGNOSIS — M25561 Pain in right knee: Secondary | ICD-10-CM | POA: Diagnosis not present

## 2024-07-04 DIAGNOSIS — M6281 Muscle weakness (generalized): Secondary | ICD-10-CM

## 2024-07-04 DIAGNOSIS — R2689 Other abnormalities of gait and mobility: Secondary | ICD-10-CM

## 2024-07-04 NOTE — Therapy (Addendum)
 OUTPATIENT PHYSICAL THERAPY TREATMENT/DISCHARGE  PHYSICAL THERAPY DISCHARGE SUMMARY  Visits from Start of Care: 12  Current functional level related to goals / functional outcomes: See goals and objective   Remaining deficits: See goals and objective   Education / Equipment: HEP   Patient agrees to discharge. Patient goals were met. Patient is being discharged due to meeting the stated rehab goals.   Patient Name: NECOLA BLUESTEIN MRN: [id number] DOB:Sep 08, 1975, 48 y.o., female Today's Date: 07/04/2024  END OF SESSION:  PT End of Session - 07/04/24 0924     Visit Number 12    Number of Visits 17    Date for Recertification  07/05/24    Authorization Type [employer]    PT Start Time 0930    PT Stop Time 1008    PT Time Calculation (min) 38 min    Activity Tolerance Patient tolerated treatment well    Behavior During Therapy [facility] for tasks assessed/performed                  History reviewed. No pertinent past medical history. Past Surgical History:  Procedure Laterality Date   CYST REMOVAL LEG     There are no active problems to display for this patient.   PCP: No PCP  REFERRING PROVIDER: Josefina Chew, MD   REFERRING DIAG: [phone] (ICD-10-CM) - Right knee pain   THERAPY DIAG:  Acute pain of right knee  Muscle weakness (generalized)  Other abnormalities of gait and mobility  Rationale for Evaluation and Treatment: Rehabilitation  ONSET DATE: 04/11/2024  SUBJECTIVE:   SUBJECTIVE STATEMENT: Pt presents to PT with reports of 2/10 R knee pain, states her whole body is a little uncomfortable due to cold and arthritis. Will start a new job this week and is feeling good about that for financial stability.   EVAL: Pt presents to PT with reports of 4 week hx of R knee pain with no trauma or MOI. Thinks it may be related to being on her feet a lot at work but is unsure. Has had a decent amount of swelling off and on over last few weeks. Stairs and squatting  have been very painful. Was going to have MRI potentially if symptoms continue.   PERTINENT HISTORY: See PMH  PAIN:  Are you having pain?  Yes: NPRS scale: 0/10 Worst: 8/10 Pain location: right knee, right thigh Pain description: sharp, tight Aggravating factors: stairs, standing, squatting Relieving factors: rest, medication  PRECAUTIONS: None  RED FLAGS: None   WEIGHT BEARING RESTRICTIONS: No  FALLS:  Has patient fallen in last 6 months? No  LIVING ENVIRONMENT: Lives with: lives with their family Lives in: House/apartment  OCCUPATION: Gas station attendant  PLOF: Independent  PATIENT GOALS: decrease knee pain, be able to go up/down stairs and ladders for work  NEXT MD VISIT: PRN  OBJECTIVE:  Note: Objective measures were completed at Evaluation unless otherwise noted.  DIAGNOSTIC FINDINGS: see imaging   PATIENT SURVEYS:  LEFS  Extreme difficulty/unable (0), Quite a bit of difficulty (1), Moderate difficulty (2), Little difficulty (3), No difficulty (4) Survey date:  05/09/2024  Any of your usual work, housework or school activities 2  2. Usual hobbies, recreational or sporting activities 1  3. Getting into/out of the bath 3  4. Walking between rooms 3  5. Putting on socks/shoes 2  6. Squatting  1  7. Lifting an object, like a bag of groceries from the floor 3  8. Performing light activities around your  home 2  9. Performing heavy activities around your home 2  10. Getting into/out of a car 0  11. Walking 2 blocks 0  12. Walking 1 mile 0  13. Going up/down 10 stairs (1 flight) 1  14. Standing for 1 hour 2  15.  sitting for 1 hour 4  16. Running on even ground 2  17. Running on uneven ground 1  18. Making sharp turns while running fast 1  19. Hopping  0  20. Rolling over in bed 2  Score total:  35/80     COGNITION: Overall cognitive status: Within functional limits for tasks assessed     SENSATION: WFL  POSTURE: rounded shoulders and weight  shift left  PALPATION: TTP to distal R quad  LOWER EXTREMITY ROM:  Active ROM Right eval Right 05/23/24 Right 05/28/24 Right 06/04/24 Right 07/04/2024  Hip flexion       Hip extension       Hip abduction       Hip adduction       Hip internal rotation       Hip external rotation       Knee flexion 95 100 90 111 AAROM 130  Knee extension 6  4  0  Ankle dorsiflexion       Ankle plantarflexion       Ankle inversion       Ankle eversion        (Blank rows = not tested)  LOWER EXTREMITY MMT:  MMT Right eval Left eval Right 06/25/24  Hip flexion 3 4   Hip extension     Hip abduction     Hip adduction     Hip internal rotation     Hip external rotation     Knee flexion 2+ 5 4+  Knee extension 2+ 5 4+  Ankle dorsiflexion     Ankle plantarflexion     Ankle inversion     Ankle eversion      (Blank rows = not tested)  LOWER EXTREMITY SPECIAL TESTS:  Knee special tests: Anterior drawer test: negative, Posterior drawer test: negative, and Lachman Test: negative  FUNCTIONAL TESTS:  30 Second Sit to Stand: 4 reps with UE 07/04/2024: 12 reps  GAIT: Distance walked: 6ft Assistive device utilized: None Level of assistance: Complete Independence Comments: antalgic gait R  TREATMENT: [facility] Adult PT Treatment:                                                DATE: 07/04/24 STS x 10 Supine heel slide with strap x 10 - 5 hold Supine QS x 10 - 5 hold R Supine SLR 2x15 R S/L hip abd 2x15 R Lateral walk RTB x 3 laps at counter Standing hip abd/ext 2x10 RTB TKE GTB R knee x 10 - 5 hold Assessment of tests/measures, goals, and outcomes for discharge  [facility] Adult PT Treatment:                                                DATE: 07/01/24 Supine QS x 10 - 5 hold R Supine SLR 3x10 R 2# S/L hip abd 2x10 R 2# LAQ 3x10 5# R Standing hip abd/ext 2x10 25# Step  up fwd 2x10 R 8in no UE Eccentric heel tap - 2in drop R stance B UE Slant board calf stretch 2x45  STS 2x10 - 10#  KB R foot back Seated hamstring curl 2x15 GTB Bridge 3x10 S/L clamshell 2x10 GTB R  [facility] Adult PT Treatment:                                                DATE: 06/27/24 Therapeutic Activity Supine QS x 10 - 5 hold R Supine SLR 3x10 R S/L hip abd 2x15 R LAQ 3x10 5# R Standing hip abd 2x10 25# STS 2x10 - 10# KB Slant board calf stretch 2x30  Step up fwd 2x10 R 8in 1 UE Step up lat 2x10 R 8in B UE  PATIENT EDUCATION:  Education details: HEP updated and discharge plan Person educated: Patient Education method: Explanation, Demonstration, and Handouts Education comprehension: verbalized understanding and returned demonstration  HOME EXERCISE PROGRAM: Access Code: [email or web address] URL: [email or web address] Date: 07/04/2024 Prepared by: Alm Kingdom  Exercises - Supine Heel Slide with Strap  - 2 x daily - 7 x weekly - 2 sets - 10 reps - 5 sec hold - Supine Quad Set (Mirrored)  - 2 x daily - 7 x weekly - 2 sets - 10 reps - 5 sec hold - Active Straight Leg Raise with Quad Set  - 1 x daily - 7 x weekly - 2-3 sets - 10 reps - Sidelying Hip Abduction  - 1 x daily - 7 x weekly - 2-3 sets - 10 reps - Sit to Stand  - 1 x daily - 7 x weekly - 2-3 sets - 10 reps - Side Stepping with Resistance at Ankles and Counter Support  - 1 x daily - 7 x weekly - 3-4 sets - red band hold - Standing Hip Abduction with Resistance at Ankles and Counter Support  - 1 x daily - 7 x weekly - 2-3 sets - 10 reps - red band hold - Standing Hip Extension with Resistance at Ankles and Counter Support  - 1 x daily - 7 x weekly - 2-3 sets - 10 reps - red band hold - Standing Terminal Knee Extension with Resistance  - 1 x daily - 7 x weekly - 3 sets - 10 reps - green band hold  ASSESSMENT:  CLINICAL IMPRESSION: Pt was able to complete all prescribed exercises with no adverse effect and demonstrated knowledge of HEP with no adverse effect. Over the course of PT treatment she has progressed very well,  improving R knee ROM, functional mobility, and overall LE strength. Pain is variable but overall is much less than when she started. She should continue to improve with HEP compliance and is ready to discharge from skilled PT at this time.   EVAL: Patient is a 48 y.o. F who was seen today for physical therapy evaluation and treatment for acute R knee pain. Physical findings are consistent with MD impression as pt demonstrates significant decrease in R knee strength and ROM as well as decrease in functional mobility. LEFS score demonstrates severe disability in performance of home ADLs and higher level activities. She would benefit from skilled PT services working to improve quad and hip strength and overall mobility and ROM in order to improve function and decrease pain.    OBJECTIVE IMPAIRMENTS: decreased activity tolerance, decreased balance, decreased mobility,  difficulty walking, decreased ROM, decreased strength, and pain  ACTIVITY LIMITATIONS: carrying, lifting, standing, squatting, stairs, transfers, and locomotion level  PARTICIPATION LIMITATIONS: driving, shopping, community activity, occupation, and yard work  PERSONAL FACTORS: Time since onset of injury/illness/exacerbation are also affecting patient's functional outcome.   REHAB POTENTIAL: Good  CLINICAL DECISION MAKING: Stable/uncomplicated  EVALUATION COMPLEXITY: Low   GOALS: Goals reviewed with patient? No  SHORT TERM GOALS: Target date: 05/30/2024   Pt will be compliant and knowledgeable with initial HEP for improved comfort and carryover Baseline: initial HEP given  Goal status: MET   2.  Pt will self report right knee pain no greater than 6/10 for improved comfort and functional ability Baseline: 10/10 at worst Goal status:  MET  LONG TERM GOALS: Target date: 07/05/2024   Pt will improve LEFS to no less than 46/80 as proxy for functional improvement with home ADLs and higher level community activity Baseline:  35/80 07/04/2024: 68/80 Goal status: MET  2.  Pt will self report right knee pain no greater than 3/10 for improved comfort and functional ability Baseline: 8/10 at worst 07/04/2024: 3/10 Goal status: MET   3.  Pt will increase 30 Second Sit to Stand rep count to no less than 8 reps for improved balance, strength, and functional mobility Baseline: 4 reps with UE 07/04/2024: 12 reps Goal status: MET   4.  Pt will improve R knee ROM to range of 0-110 degrees for improved functional mobility and decreased pain Baseline: see chart Goal status: MET  5.  Pt will improve R LE MMT to at least 4/5 for all tested motions for improved comfort and mobility Baseline: see chart Goal status: MET    PLAN:  PT FREQUENCY: 2x/week  PT DURATION: 8 weeks  PLANNED INTERVENTIONS: 97164- PT Re-evaluation, 97110-Therapeutic exercises, 97530- Therapeutic activity, [id number]- Neuromuscular re-education, 97535- Self Care, [id number]- Manual therapy, [id number]- Gait training, [phone]- Electrical stimulation (unattended), [id number]- Electrical stimulation (manual), 97016- Vasopneumatic device, 20560 (1-2 muscles), 20561 (3+ muscles)- Dry Needling, Cryotherapy, and Moist heat  PLAN FOR NEXT SESSION: assess HEP response, quad and hip strengthening   Alm JAYSON Kingdom PT  07/04/24 10:11 AM

## 2024-07-22 ENCOUNTER — Encounter: Admitting: Physical Medicine & Rehabilitation
